# Patient Record
Sex: Male | Born: 1935 | Race: White | Hispanic: No | Smoking: Never smoker
Health system: Southern US, Community
[De-identification: ages and names within clinical notes are randomized; demographics above are authoritative.]

## PROBLEM LIST (undated history)

## (undated) DIAGNOSIS — N390 Urinary tract infection, site not specified: Secondary | ICD-10-CM

## (undated) DIAGNOSIS — E538 Deficiency of other specified B group vitamins: Secondary | ICD-10-CM

## (undated) DIAGNOSIS — Z87442 Personal history of urinary calculi: Secondary | ICD-10-CM

## (undated) DIAGNOSIS — M47812 Spondylosis without myelopathy or radiculopathy, cervical region: Secondary | ICD-10-CM

## (undated) DIAGNOSIS — N201 Calculus of ureter: Principal | ICD-10-CM

## (undated) DIAGNOSIS — E785 Hyperlipidemia, unspecified: Secondary | ICD-10-CM

## (undated) DIAGNOSIS — I82409 Acute embolism and thrombosis of unspecified deep veins of unspecified lower extremity: Secondary | ICD-10-CM

## (undated) DIAGNOSIS — E7800 Pure hypercholesterolemia, unspecified: Secondary | ICD-10-CM

## (undated) HISTORY — DX: Spondylosis without myelopathy or radiculopathy, cervical region: M47.812

## (undated) HISTORY — DX: Urinary tract infection, site not specified: N39.0

## (undated) HISTORY — DX: Hyperlipidemia, unspecified: E78.5

## (undated) HISTORY — DX: Calculus of ureter: N20.1

## (undated) HISTORY — PX: EYE SURGERY: SHX253

## (undated) HISTORY — DX: Deficiency of other specified B group vitamins: E53.8

---

## 1968-05-26 HISTORY — PX: FRACTURE SURGERY: SHX138

## 1996-05-26 DIAGNOSIS — I82409 Acute embolism and thrombosis of unspecified deep veins of unspecified lower extremity: Secondary | ICD-10-CM

## 1996-05-26 HISTORY — DX: Acute embolism and thrombosis of unspecified deep veins of unspecified lower extremity: I82.409

## 2006-05-26 HISTORY — PX: HERNIA REPAIR: SHX51

## 2010-06-25 ENCOUNTER — Ambulatory Visit: Payer: Self-pay | Admitting: Ophthalmology

## 2014-02-13 DIAGNOSIS — Z136 Encounter for screening for cardiovascular disorders: Secondary | ICD-10-CM | POA: Insufficient documentation

## 2014-02-13 DIAGNOSIS — E538 Deficiency of other specified B group vitamins: Secondary | ICD-10-CM | POA: Insufficient documentation

## 2014-02-13 DIAGNOSIS — E785 Hyperlipidemia, unspecified: Secondary | ICD-10-CM

## 2014-02-13 HISTORY — DX: Hyperlipidemia, unspecified: E78.5

## 2014-02-13 HISTORY — DX: Deficiency of other specified B group vitamins: E53.8

## 2015-01-16 DIAGNOSIS — Z0000 Encounter for general adult medical examination without abnormal findings: Secondary | ICD-10-CM | POA: Insufficient documentation

## 2016-08-08 DIAGNOSIS — M47812 Spondylosis without myelopathy or radiculopathy, cervical region: Secondary | ICD-10-CM

## 2016-08-08 HISTORY — DX: Spondylosis without myelopathy or radiculopathy, cervical region: M47.812

## 2016-09-11 DIAGNOSIS — Z2821 Immunization not carried out because of patient refusal: Secondary | ICD-10-CM | POA: Insufficient documentation

## 2016-11-30 ENCOUNTER — Encounter: Payer: Self-pay | Admitting: *Deleted

## 2016-11-30 ENCOUNTER — Observation Stay
Admission: EM | Admit: 2016-11-30 | Discharge: 2016-12-01 | Disposition: A | Discharge status: Home / Self Care | Payer: Medicare Other | Attending: Urology | Admitting: Urology

## 2016-11-30 ENCOUNTER — Emergency Department: Payer: Medicare Other

## 2016-11-30 ENCOUNTER — Encounter
Admission: EM | Disposition: A | Discharge status: Home / Self Care | Payer: Self-pay | Source: Home / Self Care | Attending: Emergency Medicine

## 2016-11-30 DIAGNOSIS — N179 Acute kidney failure, unspecified: Secondary | ICD-10-CM

## 2016-11-30 DIAGNOSIS — Z79899 Other long term (current) drug therapy: Secondary | ICD-10-CM | POA: Diagnosis not present

## 2016-11-30 DIAGNOSIS — N201 Calculus of ureter: Principal | ICD-10-CM | POA: Diagnosis present

## 2016-11-30 DIAGNOSIS — E7800 Pure hypercholesterolemia, unspecified: Secondary | ICD-10-CM | POA: Insufficient documentation

## 2016-11-30 DIAGNOSIS — D72829 Elevated white blood cell count, unspecified: Secondary | ICD-10-CM | POA: Diagnosis not present

## 2016-11-30 DIAGNOSIS — Z7982 Long term (current) use of aspirin: Secondary | ICD-10-CM | POA: Diagnosis not present

## 2016-11-30 DIAGNOSIS — N390 Urinary tract infection, site not specified: Secondary | ICD-10-CM | POA: Insufficient documentation

## 2016-11-30 DIAGNOSIS — M8580 Other specified disorders of bone density and structure, unspecified site: Secondary | ICD-10-CM | POA: Insufficient documentation

## 2016-11-30 DIAGNOSIS — N200 Calculus of kidney: Secondary | ICD-10-CM | POA: Diagnosis present

## 2016-11-30 DIAGNOSIS — E872 Acidosis: Secondary | ICD-10-CM | POA: Diagnosis not present

## 2016-11-30 DIAGNOSIS — M1990 Unspecified osteoarthritis, unspecified site: Secondary | ICD-10-CM | POA: Insufficient documentation

## 2016-11-30 HISTORY — DX: Pure hypercholesterolemia, unspecified: E78.00

## 2016-11-30 HISTORY — DX: Acute embolism and thrombosis of unspecified deep veins of unspecified lower extremity: I82.409

## 2016-11-30 HISTORY — DX: Calculus of ureter: N20.1

## 2016-11-30 HISTORY — DX: Urinary tract infection, site not specified: N39.0

## 2016-11-30 LAB — URINALYSIS, COMPLETE (UACMP) WITH MICROSCOPIC
Bilirubin Urine: NEGATIVE
GLUCOSE, UA: NEGATIVE mg/dL
Ketones, ur: NEGATIVE mg/dL
NITRITE: NEGATIVE
PH: 5 (ref 5.0–8.0)
Protein, ur: 30 mg/dL — AB
SPECIFIC GRAVITY, URINE: 1.021 (ref 1.005–1.030)
Squamous Epithelial / LPF: NONE SEEN

## 2016-11-30 LAB — LIPASE, BLOOD: Lipase: 26 U/L (ref 11–51)

## 2016-11-30 LAB — PROCALCITONIN: Procalcitonin: <0.1 ng/mL

## 2016-11-30 LAB — CBC
HCT: 48.3 % (ref 40.0–52.0)
Hemoglobin: 16.2 g/dL (ref 13.0–18.0)
MCH: 31.1 pg (ref 26.0–34.0)
MCHC: 33.6 g/dL (ref 32.0–36.0)
MCV: 92.4 fL (ref 80.0–100.0)
Platelets: 196 10*3/uL (ref 150–440)
RBC: 5.22 MIL/uL (ref 4.40–5.90)
RDW: 13.9 % (ref 11.5–14.5)
WBC: 11 10*3/uL — AB (ref 3.8–10.6)

## 2016-11-30 LAB — COMPREHENSIVE METABOLIC PANEL
ALBUMIN: 4.3 g/dL (ref 3.5–5.0)
ALK PHOS: 145 U/L — AB (ref 38–126)
ALT: 17 U/L (ref 17–63)
ANION GAP: 11 (ref 5–15)
AST: 30 U/L (ref 15–41)
BILIRUBIN TOTAL: 0.8 mg/dL (ref 0.3–1.2)
BUN: 35 mg/dL — AB (ref 6–20)
CALCIUM: 9.4 mg/dL (ref 8.9–10.3)
CO2: 25 mmol/L (ref 22–32)
Chloride: 101 mmol/L (ref 101–111)
Creatinine, Ser: 1.41 mg/dL — ABNORMAL HIGH (ref 0.61–1.24)
GFR calc Af Amer: 50 mL/min — ABNORMAL LOW (ref >60)
GFR, EST NON AFRICAN AMERICAN: 43 mL/min — AB (ref >60)
GLUCOSE: 115 mg/dL — AB (ref 65–99)
Potassium: 4.6 mmol/L (ref 3.5–5.1)
Sodium: 137 mmol/L (ref 135–145)
Total Protein: 7.9 g/dL (ref 6.5–8.1)

## 2016-11-30 LAB — LACTIC ACID, PLASMA: Lactic Acid, Venous: 2 mmol/L (ref 0.5–1.9)

## 2016-11-30 SURGERY — CYSTOSCOPY, WITH STENT INSERTION
Anesthesia: Choice | Laterality: Right

## 2016-11-30 MED ORDER — FENTANYL CITRATE (PF) 100 MCG/2ML IJ SOLN
75.0000 ug | Freq: Once | INTRAMUSCULAR | Status: AC
  Administered 2016-11-30: 75 ug via INTRAVENOUS
  Filled 2016-11-30: qty 2

## 2016-11-30 MED ORDER — FENTANYL CITRATE (PF) 100 MCG/2ML IJ SOLN
INTRAMUSCULAR | Status: AC
  Filled 2016-11-30: qty 2

## 2016-11-30 MED ORDER — ONDANSETRON HCL 4 MG/2ML IJ SOLN
4.0000 mg | Freq: Once | INTRAMUSCULAR | Status: AC
  Administered 2016-11-30: 4 mg via INTRAVENOUS

## 2016-11-30 MED ORDER — IOPAMIDOL (ISOVUE-300) INJECTION 61%
75.0000 mL | Freq: Once | INTRAVENOUS | Status: AC | PRN
  Administered 2016-11-30: 75 mL via INTRAVENOUS

## 2016-11-30 MED ORDER — SUCCINYLCHOLINE CHLORIDE 20 MG/ML IJ SOLN
INTRAMUSCULAR | Status: AC
  Filled 2016-11-30: qty 1

## 2016-11-30 MED ORDER — ONDANSETRON HCL 4 MG/2ML IJ SOLN
INTRAMUSCULAR | Status: AC
  Filled 2016-11-30: qty 2

## 2016-11-30 MED ORDER — FENTANYL CITRATE (PF) 100 MCG/2ML IJ SOLN
INTRAMUSCULAR | Status: AC
  Administered 2016-12-01: 75 ug via INTRAVENOUS
  Filled 2016-11-30: qty 2

## 2016-11-30 MED ORDER — FENTANYL CITRATE (PF) 100 MCG/2ML IJ SOLN
75.0000 ug | Freq: Once | INTRAMUSCULAR | Status: AC
  Administered 2016-11-30 – 2016-12-01 (×2): 75 ug via INTRAVENOUS

## 2016-11-30 MED ORDER — DEXAMETHASONE SODIUM PHOSPHATE 10 MG/ML IJ SOLN
INTRAMUSCULAR | Status: AC
  Filled 2016-11-30: qty 1

## 2016-11-30 MED ORDER — SODIUM CHLORIDE 0.9 % IV BOLUS (SEPSIS)
1000.0000 mL | Freq: Once | INTRAVENOUS | Status: AC
  Administered 2016-11-30: 1000 mL via INTRAVENOUS

## 2016-11-30 MED ORDER — PROPOFOL 10 MG/ML IV BOLUS
INTRAVENOUS | Status: AC
  Filled 2016-11-30: qty 20

## 2016-11-30 MED ORDER — ONDANSETRON HCL 4 MG/2ML IJ SOLN
INTRAMUSCULAR | Status: AC
  Administered 2016-11-30: 4 mg via INTRAVENOUS
  Filled 2016-11-30: qty 2

## 2016-11-30 MED ORDER — LIDOCAINE HCL (PF) 2 % IJ SOLN
INTRAMUSCULAR | Status: AC
  Filled 2016-11-30: qty 2

## 2016-11-30 MED ORDER — DEXTROSE 5 % IV SOLN
1.0000 g | Freq: Once | INTRAVENOUS | Status: AC
  Administered 2016-11-30: 1 g via INTRAVENOUS
  Filled 2016-11-30: qty 10

## 2016-11-30 NOTE — ED Provider Notes (Signed)
[facility] Emergency Department Provider Note  ____________________________________________   First MD Initiated Contact with Patient 11/30/16 2143     (approximate)  I have reviewed the triage vital signs and the nursing notes.   HISTORY  Chief Complaint Abdominal Pain   HPI Lance Jones is a 83 y.o. male who comes to the emergency department with severe diffuse abdominal pain nausea and vomiting. He has a past surgical history of left inguinal hernia repair. Ever since about 3 PM today he has been unable to keep any food or water down. His pain is worse with movement and somewhat alleviated by rest. Further history is challenging to obtain secondary to the significant degree of the patient's discomfort.   History reviewed. No pertinent past medical history.  There are no active problems to display for this patient.   Past Surgical History:  Procedure Laterality Date  . HERNIA REPAIR      Prior to Admission medications   Medication Sig Start Date End Date Taking? Authorizing Provider  aspirin EC 81 MG tablet Take 81 mg by mouth daily. 07/03/09  Yes [provider]  atorvastatin (LIPITOR) 20 MG tablet Take 20 mg by mouth daily. 10/25/16  Yes [provider]  cyanocobalamin (,VITAMIN B-12,) 1000 MCG/ML injection Inject 1,000 mcg into the skin every 14 (fourteen) days. 07/25/16  Yes [provider]  Multiple Vitamins-Minerals (PRESERVISION AREDS PO) Take 1 tablet by mouth daily.   Yes [provider]    Allergies Patient has no known allergies.  History reviewed. No pertinent family history.  Social History Social History  Substance Use Topics  . Smoking status: Never Smoker  . Smokeless tobacco: Never Used  . Alcohol use No    Review of Systems Constitutional: No fever/chills Eyes: No visual changes. ENT: No sore throat. Cardiovascular: Denies chest pain. Respiratory: Denies shortness of  breath. Gastrointestinal: Positive abdominal pain.  Positive nausea, positive vomiting.  No diarrhea.  No constipation. Genitourinary: Negative for dysuria. Musculoskeletal: Negative for back pain. Skin: Negative for rash. Neurological: Negative for headaches, focal weakness or numbness.   ____________________________________________   PHYSICAL EXAM:  VITAL SIGNS: ED Triage Vitals  Enc Vitals Group     BP 11/30/16 1827 (!) 191/86     Pulse Rate 11/30/16 1827 77     Resp 11/30/16 1827 16     Temp 11/30/16 1827 97.8 F (36.6 C)     Temp Source 11/30/16 1827 Oral     SpO2 11/30/16 1827 98 %     Weight 11/30/16 1828 176 lb (79.8 kg)     Height 11/30/16 1828 6' (1.829 m)     Head Circumference --      Peak Flow --      Pain Score 11/30/16 1827 4     Pain Loc --      Pain Edu? --      Excl. in GC? --     Constitutional: Alert and oriented 4 appears extremely uncomfortable moaning and grimacing in pain Eyes: PERRL EOMI. Head: Atraumatic. Nose: No congestion/rhinnorhea. Mouth/Throat: No trismus Neck: No stridor.   Cardiovascular: Normal rate, regular rhythm. Grossly normal heart sounds.  Good peripheral circulation. Respiratory: Normal respiratory effort.  No retractions. Lungs CTAB and moving good air Gastrointestinal: Extremely tender abdomen diffusely with some rebound and guarding no focality noted significant right-sided costovertebral tenderness Musculoskeletal: No lower extremity edema   Neurologic:  Normal speech and language. No gross focal neurologic deficits are appreciated. Skin:  Skin is warm, dry and intact. No rash noted. Psychiatric: Mood and affect are normal. Speech and behavior are normal.    ____________________________________________   DIFFERENTIAL includes but not limited to  Appendicitis, diverticulitis, renal colic, pyelonephritis, mesenteric ischemia, volvulus ____________________________________________   LABS (all labs ordered are listed,  but only abnormal results are displayed)  Labs Reviewed  COMPREHENSIVE METABOLIC PANEL - Abnormal; Notable for the following:       Result Value   Glucose, Bld 115 (*)    BUN 35 (*)    Creatinine, Ser 1.41 (*)    Alkaline Phosphatase 145 (*)    GFR calc non Af Amer 43 (*)    GFR calc Af Amer 50 (*)    All other components within normal limits  CBC - Abnormal; Notable for the following:    WBC 11.0 (*)    All other components within normal limits  URINALYSIS, COMPLETE (UACMP) WITH MICROSCOPIC - Abnormal; Notable for the following:    Color, Urine YELLOW (*)    APPearance CLOUDY (*)    Hgb urine dipstick MODERATE (*)    Protein, ur 30 (*)    Leukocytes, UA SMALL (*)    Bacteria, UA RARE (*)    All other components within normal limits  LACTIC ACID, PLASMA - Abnormal; Notable for the following:    Lactic Acid, Venous 2.0 (*)    All other components within normal limits  CULTURE, BLOOD (ROUTINE X 2)  CULTURE, BLOOD (ROUTINE X 2)  URINE CULTURE  LIPASE, BLOOD  LACTIC ACID, PLASMA  PROCALCITONIN    GFR of 43 down from last GFR of 57 on April 18 this year __________________________________________  EKG   ____________________________________________  RADIOLOGY  CT scan shows large right-sided kidney stone with hydronephrosis and decreased right renal function   PROCEDURES  Procedure(s) performed: no  Procedures  Critical Care performed: yes  CRITICAL CARE Performed by: Merrily Brittle   Total critical care time: 35 minutes  Critical care time was exclusive of separately billable procedures and treating other patients.  Critical care was necessary to treat or prevent imminent or life-threatening deterioration.  Critical care was time spent personally by me on the following activities: development of treatment plan with patient and/or surrogate as well as nursing, discussions with consultants, evaluation of patient's response to treatment, examination of patient,  obtaining history from patient or surrogate, ordering and performing treatments and interventions, ordering and review of laboratory studies, ordering and review of radiographic studies, pulse oximetry and re-evaluation of patient's condition.   Observation: no ____________________________________________   INITIAL IMPRESSION / ASSESSMENT AND PLAN / ED COURSE  Pertinent labs & imaging results that were available during my care of the patient were reviewed by me and considered in my medical decision making (see chart for details).  The patient arrives extremely uncomfortable appearing with tender abdomen. Differential for an 83 year old with this degree of abdominal pain is broad but she requires a lactate as well as a CT scan with IV contrast. IV fentanyl now.  After 75 g of fentanyl the patient is still in persistent pain requiring another 75 g of fentanyl. By my read his CT scan appears to show a 1 cm stone on the right side with hydronephrosis. While he has no nitrites he does have significant costovertebral tenderness and suggestion of possible infection. I anticipate a urology consult.      __________----------------------------------------- 10:57 PM on 11/30/2016 -----------------------------------------  I discussed the case with on-call urologist Dr. Annabell Howells who  will kindly consult on the case. He will evaluate CT scan but anticipates he will take the patient to the operating room tonight for a stent. __________________________________   FINAL CLINICAL IMPRESSION(S) / ED DIAGNOSES  Final diagnoses:  Kidney stone  Acute kidney injury (HCC)      NEW MEDICATIONS STARTED DURING THIS VISIT:  New Prescriptions   No medications on file     Note:  This document was prepared using Dragon voice recognition software and may include unintentional dictation errors.     Merrily Brittleifenbark, [place] Wherry, MD 11/30/16 2258

## 2016-11-30 NOTE — H&P (Signed)
Subjective: CC: Right flank pain.  Hx: I was asked to see Mr. Basher by Dr. Lamont Snowball for an 8mm right proximal ureteral stone with poor pain control and a possible UTI with a WBC count of 11K and a lactate of 2.  The has TNTC WBC and RBC but no fever or chills.  He first had some pain last Monday but it came back Sunday afternoon and was severe with nausea but no hematuria or voiding complaints.   He has no prior GU history.   He continues to have some pain despite pain medication.   He has no associated signs or symptoms.  ROS:  Review of Systems  Genitourinary: Positive for flank pain.  All other systems reviewed and are negative.   No Known Allergies  Past Medical History:  Diagnosis Date  . DVT (deep venous thrombosis) (HCC) 1998   left leg  . Hypercholesteremia     Past Surgical History:  Procedure Laterality Date  . HERNIA REPAIR      Social History   Social History  . Marital status: Married    Spouse name: N/A  . Number of children: N/A  . Years of education: N/A   Occupational History  . Not on file.   Social History Main Topics  . Smoking status: Never Smoker  . Smokeless tobacco: Never Used  . Alcohol use No  . Drug use: No  . Sexual activity: No   Other Topics Concern  . Not on file   Social History Narrative  . No narrative on file    History reviewed. No pertinent family history.  Anti-infectives: Anti-infectives    Start     Dose/Rate Route Frequency Ordered Stop   11/30/16 2200  cefTRIAXone (ROCEPHIN) 1 g in dextrose 5 % 50 mL IVPB     1 g 100 mL/hr over 30 Minutes Intravenous  Once 11/30/16 2148 11/30/16 2302      No current facility-administered medications for this encounter.    Current Outpatient Prescriptions  Medication Sig Dispense Refill  . aspirin EC 81 MG tablet Take 81 mg by mouth daily.    Marland Kitchen atorvastatin (LIPITOR) 20 MG tablet Take 20 mg by mouth daily.  1  . cyanocobalamin (,VITAMIN B-12,) 1000 MCG/ML injection  Inject 1,000 mcg into the skin every 14 (fourteen) days.    . Multiple Vitamins-Minerals (PRESERVISION AREDS PO) Take 1 tablet by mouth daily.       Objective: Vital signs in last 24 hours: Temp:  [97.7 F (36.5 C)-97.8 F (36.6 C)] 97.7 F (36.5 C) (07/08 2044) Pulse Rate:  [68-77] 68 (07/08 2258) Resp:  [16-24] 22 (07/08 2258) BP: (182-191)/(80-86) 182/80 (07/08 2258) SpO2:  [98 %-100 %] 99 % (07/08 2258) Weight:  [79.8 kg (176 lb)] 79.8 kg (176 lb) (07/08 1828)  Intake/Output from previous day: No intake/output data recorded. Intake/Output this shift: No intake/output data recorded.   Physical Exam  Constitutional: He is oriented to person, place, and time and well-developed, well-nourished, and in no distress.  HENT:  Head: Normocephalic and atraumatic.  Neck: Normal range of motion. Neck supple. No thyromegaly present.  Cardiovascular: Normal rate, regular rhythm and normal heart sounds.   Pulmonary/Chest: Effort normal and breath sounds normal. No respiratory distress.  Abdominal: Soft. He exhibits no mass. There is tenderness (right CVAT).  Musculoskeletal: Normal range of motion. He exhibits no edema.  Lymphadenopathy:    He has no cervical adenopathy.       Right: No supraclavicular adenopathy present.  Left: No supraclavicular adenopathy present.  Neurological: He is alert and oriented to person, place, and time.  Skin: Skin is warm and dry.  Psychiatric: Mood and affect normal.  Vitals reviewed.   Lab Results:   Recent Labs  11/30/16 1826  WBC 11.0*  HGB 16.2  HCT 48.3  PLT 196   BMET  Recent Labs  11/30/16 1826  NA 137  K 4.6  CL 101  CO2 25  GLUCOSE 115*  BUN 35*  CREATININE 1.41*  CALCIUM 9.4   PT/INR No results for input(s): LABPROT, INR in the last 72 hours. ABG No results for input(s): PHART, HCO3 in the last 72 hours.  Invalid input(s): PCO2, PO2  Studies/Results: Ct Abdomen Pelvis W Contrast  Result Date:  11/30/2016 CLINICAL DATA:  Bilateral lower quadrant pain beginning today with nausea. History of hernia repair. EXAM: CT ABDOMEN AND PELVIS WITH CONTRAST TECHNIQUE: Multidetector CT imaging of the abdomen and pelvis was performed using the standard protocol following bolus administration of intravenous contrast. CONTRAST:  75mL ISOVUE-300 IOPAMIDOL (ISOVUE-300) INJECTION 61% COMPARISON:  None. FINDINGS: LOWER CHEST: Dependent atelectasis or scarring. 3 mm sub solid RIGHT middle lobe subpleural pulmonary nodules likely benign. The heart is mildly enlarged. No pericardial effusions. 19 mm partially imaged cystic lesion adjacent to RIGHT atrium suggesting pericardial cyst. HEPATOBILIARY: Multiple subcentimeter gallstones without CT findings of acute cholecystitis. Normal liver. PANCREAS: Normal. SPLEEN: Normal. ADRENALS/URINARY TRACT: Kidneys are orthotopic, delayed RIGHT nephrogram. Moderate RIGHT hydro nephrosis to the proximal ureter where a 5 x 8 x 10 mm calculus is present. Small LEFT parapelvic cysts and 2 mm LEFT lower pole nephrolithiasis. No RIGHT contrast excretion on delayed phase. No solid renal mass. Urinary bladder is well distended and unremarkable. STOMACH/BOWEL: The stomach, small and large bowel are normal in course and caliber without inflammatory changes. Mild sigmoid diverticulosis. Normal appendix. VASCULAR/LYMPHATIC: Aortoiliac vessels are normal in course and caliber, mild calcific atherosclerosis. No lymphadenopathy by CT size criteria. REPRODUCTIVE: Mild prostatomegaly. OTHER: No intraperitoneal free fluid or free air. MUSCULOSKELETAL: Nonacute. Status post LEFT inguinal hernia repair with small fat containing inguinal hernia. Osteopenia. IMPRESSION: 1. 5 x 8 x 10 mm proximal RIGHT ureteral calculus results in moderate obstructive uropathy and decreased RIGHT renal function. 2. 2 mm nonobstructing LEFT lower pole nephrolithiasis. 3. Cholelithiasis without CT findings of acute cholecystitis.  Aortic Atherosclerosis (ICD10-I70.0). Electronically Signed   By: Awilda Metroourtnay  Bloomer M.D.   On: 11/30/2016 22:42   CT films and report reviewed.   Labs reviewed.   Case discussed with Dr. Lamont Snowballifenbark.   Assessment: Right proximal ureteral stone with obstruction and possible infection.   I am going to take him for cystoscopy and stenting this evening.   Risks of bleeding, infection, ureteral injury, need for secondary procedures, thrombotic events and anesthetic risks reviewed.   ARI with Cr of 1.41.    CC: Dr. Milagros EvenerNiel Rifenbark      Bjorn PippinWRENN,Pricella Gaugh J 12/01/2016 [phone]

## 2016-11-30 NOTE — ED Notes (Signed)
Pt sitting in wheelchair.  Pt alert.  Speech clear.  Pt continues to have right side pain.  intermittent nausea.  Family with pt.

## 2016-11-30 NOTE — Anesthesia Preprocedure Evaluation (Signed)
Anesthesia Evaluation  Patient identified by MRN, date of birth, ID band Patient awake    Reviewed: Allergy & Precautions  Airway Mallampati: II       Dental no notable dental hx.    Pulmonary neg pulmonary ROS,    Pulmonary exam normal        Cardiovascular negative cardio ROS Normal cardiovascular exam     Neuro/Psych negative neurological ROS  negative psych ROS   GI/Hepatic negative GI ROS, Neg liver ROS,   Endo/Other  negative endocrine ROS  Renal/GU Renal InsufficiencyRenal disease     Musculoskeletal  (+) Arthritis ,   Abdominal Normal abdominal exam  (+)   Peds negative pediatric ROS (+)  Hematology negative hematology ROS (+)   Anesthesia Other Findings History reviewed. No pertinent past medical history.  Reproductive/Obstetrics                             Anesthesia Physical Anesthesia Plan  ASA: II and emergent  Anesthesia Plan: General   Post-op Pain Management:    Induction:   PONV Risk Score and Plan: 3 and Ondansetron, Dexamethasone, Propofol, Midazolam and Treatment may vary due to age or medical condition  Airway Management Planned: Oral ETT  Additional Equipment:   Intra-op Plan:   Post-operative Plan: Extubation in OR  Informed Consent:   Plan Discussed with: CRNA and Surgeon  Anesthesia Plan Comments:         Anesthesia Quick Evaluation

## 2016-11-30 NOTE — ED Triage Notes (Signed)
PT to ED reporting lower abd pain in RLQ and LLQ. Beginning at 150 today. Pt reports nausea with one episode of vomiting. No diarrhea or fevers reported. Tenderness noted upon palpation.

## 2016-11-30 NOTE — ED Notes (Signed)
MD Rifenbark notified about lactic 2.0.

## 2016-12-01 ENCOUNTER — Emergency Department: Payer: Medicare Other | Admitting: Anesthesiology

## 2016-12-01 ENCOUNTER — Telehealth: Payer: Self-pay | Admitting: Urology

## 2016-12-01 ENCOUNTER — Encounter
Admission: EM | Disposition: A | Discharge status: Home / Self Care | Payer: Self-pay | Source: Home / Self Care | Attending: Emergency Medicine

## 2016-12-01 DIAGNOSIS — N201 Calculus of ureter: Secondary | ICD-10-CM | POA: Diagnosis not present

## 2016-12-01 HISTORY — DX: Calculus of ureter: N20.1

## 2016-12-01 HISTORY — PX: CYSTOSCOPY WITH STENT PLACEMENT: SHX5790

## 2016-12-01 LAB — BASIC METABOLIC PANEL
ANION GAP: 10 (ref 5–15)
BUN: 33 mg/dL — AB (ref 6–20)
CHLORIDE: 105 mmol/L (ref 101–111)
CO2: 22 mmol/L (ref 22–32)
Calcium: 8.8 mg/dL — ABNORMAL LOW (ref 8.9–10.3)
Creatinine, Ser: 1.6 mg/dL — ABNORMAL HIGH (ref 0.61–1.24)
GFR calc Af Amer: 43 mL/min — ABNORMAL LOW (ref >60)
GFR, EST NON AFRICAN AMERICAN: 37 mL/min — AB (ref >60)
GLUCOSE: 140 mg/dL — AB (ref 65–99)
POTASSIUM: 4.5 mmol/L (ref 3.5–5.1)
Sodium: 137 mmol/L (ref 135–145)

## 2016-12-01 LAB — LACTIC ACID, PLASMA: Lactic Acid, Venous: 1.5 mmol/L (ref 0.5–1.9)

## 2016-12-01 LAB — HEMOGLOBIN AND HEMATOCRIT, BLOOD
HEMATOCRIT: 43.7 % (ref 40.0–52.0)
Hemoglobin: 15 g/dL (ref 13.0–18.0)

## 2016-12-01 SURGERY — CYSTOSCOPY, WITH STENT INSERTION
Anesthesia: General | Laterality: Right

## 2016-12-01 MED ORDER — ATORVASTATIN CALCIUM 20 MG PO TABS: 20.0000 mg | ORAL_TABLET | Freq: Every day | ORAL | Status: DC

## 2016-12-01 MED ORDER — SULFAMETHOXAZOLE-TRIMETHOPRIM 800-160 MG PO TABS: 1.0000 | ORAL_TABLET | Freq: Two times a day (BID) | ORAL | 0 refills | Status: DC

## 2016-12-01 MED ORDER — ONDANSETRON HCL 4 MG/2ML IJ SOLN
INTRAMUSCULAR | Status: DC | PRN
  Administered 2016-12-01: 4 mg via INTRAVENOUS

## 2016-12-01 MED ORDER — POTASSIUM CHLORIDE IN NACL 20-0.45 MEQ/L-% IV SOLN
INTRAVENOUS | Status: DC
  Administered 2016-12-01: 03:00:00 via INTRAVENOUS
  Filled 2016-12-01 (×3): qty 1000

## 2016-12-01 MED ORDER — SENNOSIDES-DOCUSATE SODIUM 8.6-50 MG PO TABS: 1.0000 | ORAL_TABLET | Freq: Every evening | ORAL | Status: DC | PRN

## 2016-12-01 MED ORDER — ONDANSETRON HCL 4 MG/2ML IJ SOLN: 4.0000 mg | Freq: Once | INTRAMUSCULAR | Status: DC | PRN

## 2016-12-01 MED ORDER — FENTANYL CITRATE (PF) 100 MCG/2ML IJ SOLN: 25.0000 ug | INTRAMUSCULAR | Status: DC | PRN

## 2016-12-01 MED ORDER — EPHEDRINE SULFATE 50 MG/ML IJ SOLN
INTRAMUSCULAR | Status: DC | PRN
  Administered 2016-12-01: 10 mg via INTRAVENOUS

## 2016-12-01 MED ORDER — SODIUM CHLORIDE 0.9 % IV SOLN
INTRAVENOUS | Status: DC | PRN
  Administered 2016-12-01: via INTRAVENOUS

## 2016-12-01 MED ORDER — HYOSCYAMINE SULFATE 0.125 MG SL SUBL
0.1250 mg | SUBLINGUAL_TABLET | SUBLINGUAL | Status: DC | PRN
  Filled 2016-12-01: qty 1

## 2016-12-01 MED ORDER — PROPOFOL 10 MG/ML IV BOLUS
INTRAVENOUS | Status: DC | PRN
  Administered 2016-12-01: 110 mg via INTRAVENOUS

## 2016-12-01 MED ORDER — BISACODYL 10 MG RE SUPP: 10.0000 mg | Freq: Every day | RECTAL | Status: DC | PRN

## 2016-12-01 MED ORDER — HYDROMORPHONE HCL 1 MG/ML IJ SOLN: 0.5000 mg | INTRAMUSCULAR | Status: DC | PRN

## 2016-12-01 MED ORDER — ASPIRIN EC 81 MG PO TBEC
81.0000 mg | DELAYED_RELEASE_TABLET | Freq: Every day | ORAL | Status: DC
  Administered 2016-12-01: 81 mg via ORAL
  Filled 2016-12-01: qty 1

## 2016-12-01 MED ORDER — HYDROCODONE-ACETAMINOPHEN 5-325 MG PO TABS: 1.0000 | ORAL_TABLET | ORAL | Status: DC | PRN

## 2016-12-01 MED ORDER — HYDROCODONE-ACETAMINOPHEN 5-325 MG PO TABS: 1.0000 | ORAL_TABLET | ORAL | 0 refills | Status: DC | PRN

## 2016-12-01 MED ORDER — CEFTRIAXONE SODIUM 1 G IJ SOLR
1.0000 g | INTRAMUSCULAR | Status: DC
  Filled 2016-12-01: qty 10

## 2016-12-01 MED ORDER — DEXAMETHASONE SODIUM PHOSPHATE 10 MG/ML IJ SOLN
INTRAMUSCULAR | Status: DC | PRN
  Administered 2016-12-01: 10 mg via INTRAVENOUS

## 2016-12-01 MED ORDER — ACETAMINOPHEN 325 MG PO TABS: 650.0000 mg | ORAL_TABLET | ORAL | Status: DC | PRN

## 2016-12-01 MED ORDER — ONDANSETRON HCL 4 MG/2ML IJ SOLN: 4.0000 mg | INTRAMUSCULAR | Status: DC | PRN

## 2016-12-01 MED ORDER — SUCCINYLCHOLINE CHLORIDE 20 MG/ML IJ SOLN
INTRAMUSCULAR | Status: DC | PRN
  Administered 2016-12-01: 100 mg via INTRAVENOUS

## 2016-12-01 MED ORDER — FLEET ENEMA 7-19 GM/118ML RE ENEM: 1.0000 | ENEMA | Freq: Once | RECTAL | Status: DC | PRN

## 2016-12-01 MED ORDER — LIDOCAINE HCL (CARDIAC) 20 MG/ML IV SOLN
INTRAVENOUS | Status: DC | PRN
  Administered 2016-12-01: 100 mg via INTRAVENOUS

## 2016-12-01 MED ORDER — FENTANYL CITRATE (PF) 100 MCG/2ML IJ SOLN
INTRAMUSCULAR | Status: DC | PRN
  Administered 2016-12-01 (×2): 50 ug via INTRAVENOUS

## 2016-12-01 MED ORDER — BELLADONNA ALKALOIDS-OPIUM 16.2-60 MG RE SUPP
RECTAL | Status: AC
  Filled 2016-12-01: qty 1

## 2016-12-01 SURGICAL SUPPLY — 19 items
BAG DRAIN CYSTO-URO LG1000N (MISCELLANEOUS) ×3 IMPLANT
CANISTER SUCT LVC 12 LTR MEDI- (MISCELLANEOUS) ×3 IMPLANT
DRAPE XRAY CASSETTE 23X24 (DRAPES) ×3 IMPLANT
GLOVE BIOGEL M 7.0 STRL (GLOVE) ×12 IMPLANT
GOWN STRL REUS W/ TWL LRG LVL4 (GOWN DISPOSABLE) ×1 IMPLANT
GOWN STRL REUS W/ TWL XL LVL3 (GOWN DISPOSABLE) ×1 IMPLANT
GOWN STRL REUS W/TWL LRG LVL4 (GOWN DISPOSABLE) ×2
GOWN STRL REUS W/TWL XL LVL3 (GOWN DISPOSABLE) ×2
KIT RM TURNOVER CYSTO AR (KITS) ×3 IMPLANT
NS IRRIG 500ML POUR BTL (IV SOLUTION) IMPLANT
PACK CYSTO AR (MISCELLANEOUS) ×3 IMPLANT
SENSORWIRE 0.038 NOT ANGLED (WIRE) ×3
SET CYSTO W/LG BORE CLAMP LF (SET/KITS/TRAYS/PACK) ×3 IMPLANT
SLEEVE SCD COMPRESS THIGH MED (MISCELLANEOUS) ×3 IMPLANT
SOL .9 NS 3000ML IRR  AL (IV SOLUTION) ×2
SOL .9 NS 3000ML IRR UROMATIC (IV SOLUTION) ×1 IMPLANT
STENT URET 6FRX26 CONTOUR (STENTS) ×3 IMPLANT
WATER STERILE IRR 1000ML POUR (IV SOLUTION) ×3 IMPLANT
WIRE SENSOR 0.038 NOT ANGLED (WIRE) ×1 IMPLANT

## 2016-12-01 NOTE — Op Note (Signed)
NAMGerrit Jones:  Stemmer, Quirino            ACCOUNT NO.:  [id number]  MEDICAL RECORD NO.:  [id number]  LOCATION:  ED25A                        FACILITY:  [facility]  PHYSICIAN:  Excell SeltzerJohn J. Annabell HowellsWrenn, M.D.    DATE OF BIRTH:  04-20-1928  DATE OF PROCEDURE:  11/30/2016 DATE OF DISCHARGE:                              OPERATIVE REPORT   Patient of Dr. Bjorn PippinJohn Saige Canton.  PROCEDURE PERFORMED:  Cystoscopy with insertion of right double-J stent.  SURGEON:  Excell SeltzerJohn J. Annabell HowellsWrenn, M.D.  PREOPERATIVE DIAGNOSIS:  Right ureteropelvic junction stone with possible infection.  POSTOPERATIVE DIAGNOSIS:  Right ureteropelvic junction stone with possible infection.  SURGEON:  Excell SeltzerJohn J. Annabell HowellsWrenn, M.D.  ANESTHESIA:  General.  SPECIMEN:  None.  DRAINS:  A 6-French x 26 cm right double-J stent.  COMPLICATIONS:  None.  BLOOD LOSS:  None.  INDICATIONS:  Mr. Ysidro EvertCulberson is an 83 year old white male, who presented in the emergency room today with severe right flank pain.  He was found to have an 8 mm right UPJ/proximal ureteral stone.  The urine had too numerous to count white cells, too numerous to count red cells.  He had mild leukocytosis and elevated lactic acid.  It was felt that cystoscopy with stenting was indicated.  DESCRIPTION OF PROCEDURE:  He was taken to the operating room where a general anesthetic was induced.  He was placed in lithotomy position. His perineum and genitalia were prepped with Betadine solution, he was draped in usual sterile fashion.  He had received Rocephin earlier in the emergency room.  Cystoscopy was performed using a 23-French scope and 30-degree lens. Examination revealed a normal urethra.  The external sphincter was intact.  The prostatic urethra was short without obstruction. Examination of bladder revealed mild trabeculation.  No tumors, stones, or inflammation were noted.  Ureteral orifices were unremarkable.  The right ureteral orifice was cannulated with a Sensor or guidewire. This was  passed easily up the ureter to the stone where there was mild resistance, but the stone eventually popped back into the kidney and the wire passed into the kidney.  Brisk efflux of turbid urine was noted along the stent, increasing the concern for pyonephrosis infection.  A 6-French 26 cm Contour double-J stent was then passed over the wire without difficulty to the kidney.  Under fluoroscopic guidance, the wire was removed, leaving good coil in the kidney and a good coil in the bladder.  The bladder was drained.  The cystoscope was removed.  The patient was taken down from lithotomy position.  His anesthetic was reversed.  He was moved to the recovery room in stable condition.  There were no complications.     Excell SeltzerJohn J. Annabell HowellsWrenn, M.D.     JJW/MEDQ  D:  12/01/2016  T:  12/01/2016  Job:  [id number]

## 2016-12-01 NOTE — Transfer of Care (Signed)
Immediate Anesthesia Transfer of Care Note  Patient: Lance BellsJoseph S Blahut  Procedure(s) Performed: Procedure(s): CYSTOSCOPY WITH STENT PLACEMENT (Right)  Patient Location: PACU  Anesthesia Type:General  Level of Consciousness: sedated  Airway & Oxygen Therapy: Patient Spontanous Breathing and Patient connected to face mask oxygen  Post-op Assessment: Report given to RN and Post -op Vital signs reviewed and stable  Post vital signs: Reviewed and stable  Last Vitals:  Vitals:   11/30/16 2258 12/01/16 0044  BP: (!) 182/80 130/60  Pulse: 68 (!) 105  Resp: (!) 22 20  Temp:  36.7 C    Last Pain:  Vitals:   12/01/16 0044  TempSrc: Tympanic  PainSc:          Complications: No apparent anesthesia complications

## 2016-12-01 NOTE — Anesthesia Procedure Notes (Signed)
Procedure Name: Intubation Date/Time: 12/01/2016 12:17 AM Performed by: Ginger CarneMICHELET, Donnika Kucher Pre-anesthesia Checklist: Patient identified, Emergency Drugs available, Suction available, Patient being monitored and Timeout performed Patient Re-evaluated:Patient Re-evaluated prior to inductionOxygen Delivery Method: Circle system utilized Preoxygenation: Pre-oxygenation with 100% oxygen Intubation Type: IV induction, Rapid sequence and Cricoid Pressure applied Laryngoscope Size: Miller and 2 Grade View: Grade I Tube type: Oral Tube size: 7.5 mm Number of attempts: 1 Airway Equipment and Method: Stylet Placement Confirmation: ETT inserted through vocal cords under direct vision,  positive ETCO2 and breath sounds checked- equal and bilateral Secured at: 22 cm Tube secured with: Tape Dental Injury: Teeth and Oropharynx as per pre-operative assessment

## 2016-12-01 NOTE — Discharge Summary (Signed)
Date of admission: 11/30/2016  Date of discharge: 12/01/2016  Admission diagnosis: Right obstructing ureteral stone, UTI  Discharge diagnosis: same  Secondary diagnoses:  Patient Active Problem List   Diagnosis Date Noted  . Right ureteral stone 12/01/2016  . Ureteral calculus, right 11/30/2016  . Urinary tract infection 11/30/2016    History and Physical: For full details, please see admission history and physical. Briefly, MCARTHUR IVINS is a 83 y.o. year old patient with an obstructing 8 mm R UPJ stone s/p urgent ureteral stenting admitted overnight for observation.   Hospital Course: Patient tolerated the procedure well.  He was then transferred to the floor after an uneventful PACU stay.  His hospital course was uncomplicated.  On POD#1 he had met discharge criteria: was eating a regular diet, was up and ambulating independently,  pain was well controlled, was voiding without a catheter, and was ready to for discharge.    Physical Exam  Constitutional: He is oriented to person, place, and time. He appears well-developed.  HENT:  Head: Normocephalic and atraumatic.  Neck: Neck supple.  Pulmonary/Chest: Effort normal. No respiratory distress.  Genitourinary:  Genitourinary Comments: No cva tenderness  Neurological: He is alert and oriented to person, place, and time.  Skin: Skin is warm and dry.  Psychiatric: He has a normal mood and affect. His behavior is normal.  Vitals reviewed.   Laboratory values:   Recent Labs  11/30/16 1826 12/01/16 0150  WBC 11.0*  --   HGB 16.2 15.0  HCT 48.3 43.7    Recent Labs  11/30/16 1826 12/01/16 0150  NA 137 137  K 4.6 4.5  CL 101 105  CO2 25 22  GLUCOSE 115* 140*  BUN 35* 33*  CREATININE 1.41* 1.60*  CALCIUM 9.4 8.8*   No results for input(s): LABPT, INR in the last 72 hours. No results for input(s): LABURIN in the last 72 hours. No results found for this or any previous visit.  Disposition: Home  Discharge  instruction:  You have a ureteral stent in place.  This is a tube that extends from your kidney to your bladder.  This may cause urinary bleeding, burning with urination, and urinary frequency.  Please call our office or present to the ED if you develop fevers >101 or pain which is not able to be controlled with oral pain medications.  You may be given either Flomax and/ or ditropan to help with bladder spasms and stent pain in addition to pain medications.    [address] [place], [place] [address] [phone]   Discharge medications: Allergies as of 12/01/2016   No Known Allergies     Medication List    TAKE these medications   aspirin EC 81 MG tablet Take 81 mg by mouth daily.   atorvastatin 20 MG tablet Commonly known as:  LIPITOR Take 20 mg by mouth daily.   cyanocobalamin 1000 MCG/ML injection Commonly known as:  (VITAMIN B-12) Inject 1,000 mcg into the skin every 14 (fourteen) days.   HYDROcodone-acetaminophen 5-325 MG tablet Commonly known as:  NORCO/VICODIN Take 1-2 tablets by mouth every 4 (four) hours as needed for moderate pain.   PRESERVISION AREDS PO Take 1 tablet by mouth daily.   sulfamethoxazole-trimethoprim 800-160 MG tablet Commonly known as:  BACTRIM DS,SEPTRA DS Take 1 tablet by mouth 2 (two) times daily.       Followup:  Follow-up Information    Hollice Espy, MD In 1 week.  Specialty:  Urology Why:  any provider to discuss stone surgery Contact information: [address] [place] [id number] [phone]

## 2016-12-01 NOTE — Anesthesia Post-op Follow-up Note (Cosign Needed)
Anesthesia QCDR form completed.        

## 2016-12-01 NOTE — Telephone Encounter (Signed)
App made and mailed to patient. Left message for him to cb to give app to.  Marcelino DusterMichelle

## 2016-12-01 NOTE — Brief Op Note (Signed)
11/30/2016 - 12/01/2016  12:31 AM  PATIENT:  Lance Jones  83 y.o. male  PRE-OPERATIVE DIAGNOSIS:  right ureteral stones with UTI   POST-OPERATIVE DIAGNOSIS:  same as preop  PROCEDURE:  Procedure(s): CYSTOSCOPY WITH STENT PLACEMENT (Right)  SURGEON:  Surgeon(s) and Role:    Bjorn Pippin* Shannelle Alguire, MD - Primary  PHYSICIAN ASSISTANT:   ASSISTANTS: none   ANESTHESIA:   general  EBL:  No intake/output data recorded.  BLOOD ADMINISTERED:none  DRAINS: 6 x 26 right JJ stent   LOCAL MEDICATIONS USED:  NONE  SPECIMEN:  No Specimen  DISPOSITION OF SPECIMEN:  N/A  COUNTS:  YES  TOURNIQUET:  * No tourniquets in log *  DICTATION: .Other Dictation: Dictation Number [id number]  PLAN OF CARE: Admit for overnight observation  PATIENT DISPOSITION:  PACU - hemodynamically stable.   Delay start of Pharmacological VTE agent (>24hrs) due to surgical blood loss or risk of bleeding: not applicable

## 2016-12-01 NOTE — Discharge Instructions (Addendum)
You have a ureteral stent in place.  This is a tube that extends from your kidney to your bladder.  This may cause urinary bleeding, burning with urination, and urinary frequency.  Please call our office or present to the ED if you develop fevers >101 or pain which is not able to be controlled with oral pain medications.  You may be given either Flomax and/ or ditropan to help with bladder spasms and stent pain in addition to pain medications.    [facility] [address], [address] [place], [place] [address] [phone] 

## 2016-12-01 NOTE — Telephone Encounter (Signed)
-----   Message from Bjorn PippinJohn Wrenn, MD sent at 12/01/2016 12:39 AM EDT ----- I stented this patient for an 8mm RUPJ stone and possible infection.  He will need ESWL or ureteroscopy in a week or so.   I am admitting him to observation.

## 2016-12-01 NOTE — Care Management Obs Status (Signed)
MEDICARE OBSERVATION STATUS NOTIFICATION   Patient Details  Name: Lance Jones MRN: [id number] Date of Birth: 1927/12/25   Medicare Observation Status Notification Given:  No (Admitted obs less than 24 hours)    Chapman FitchBOWEN, Shahram Alexopoulos T, RN 12/01/2016, 10:12 AM

## 2016-12-01 NOTE — Anesthesia Postprocedure Evaluation (Signed)
Anesthesia Post Note  Patient: Lake BellsJoseph S Reth  Procedure(s) Performed: Procedure(s) (LRB): CYSTOSCOPY WITH STENT PLACEMENT (Right)  Patient location during evaluation: PACU Anesthesia Type: General Level of consciousness: awake and alert and oriented Pain management: pain level controlled Vital Signs Assessment: post-procedure vital signs reviewed and stable Respiratory status: spontaneous breathing Cardiovascular status: blood pressure returned to baseline Anesthetic complications: no     Last Vitals:  Vitals:   12/01/16 0045 12/01/16 0050  BP:    Pulse: 87 88  Resp: (!) 24 (!) 24  Temp:      Last Pain:  Vitals:   12/01/16 0044  TempSrc: Tympanic  PainSc:                  Leeandra Ellerson

## 2016-12-01 NOTE — Progress Notes (Signed)
Discharge paperwork reviewed with pt. Questions answered to satisfaction of pt. IVs removed per order.

## 2016-12-02 ENCOUNTER — Encounter: Payer: Self-pay | Admitting: Urology

## 2016-12-02 LAB — URINE CULTURE

## 2016-12-05 LAB — CULTURE, BLOOD (ROUTINE X 2)
CULTURE: NO GROWTH
Culture: NO GROWTH
SPECIAL REQUESTS: ADEQUATE

## 2016-12-08 ENCOUNTER — Encounter: Payer: Self-pay | Admitting: Urology

## 2016-12-08 ENCOUNTER — Ambulatory Visit: Payer: Medicare Other | Admitting: Urology

## 2016-12-08 VITALS — BP 160/71 | HR 71 | Ht 72.0 in | Wt 182.0 lb

## 2016-12-08 DIAGNOSIS — N201 Calculus of ureter: Secondary | ICD-10-CM | POA: Diagnosis not present

## 2016-12-08 LAB — URINALYSIS, COMPLETE
Bilirubin, UA: NEGATIVE
Glucose, UA: NEGATIVE
NITRITE UA: NEGATIVE
PH UA: 5.5 (ref 5.0–7.5)
Specific Gravity, UA: 1.03 (ref 1.005–1.030)
Urobilinogen, Ur: 1 mg/dL (ref 0.2–1.0)

## 2016-12-08 LAB — MICROSCOPIC EXAMINATION

## 2016-12-08 NOTE — Progress Notes (Signed)
12/08/2016 2:02 PM   Lance Jones Nov 28, 1927 [id number]  Referring provider: [name], MD [address] [facility] [address] [place], [place] [address]  Chief Complaint  Patient presents with  . Nephrolithiasis    discuss surgery    HPI: Male presents today for further management and evaluation of her right mid ureteral stone. He is now status post right stent placement by Dr. Annabell Jones. He was discharged home on Bactrim DS. The patient has recovered well. He continues to have a slight ache, but otherwise is doing well. He denies any fevers or chills. He denies any dysuria. He has had a little bit of urinary frequency.  The patient has no additional stones in his right collecting system. He did not undergo a KUB prior to his appointment today.     PMH: Past Medical History:  Diagnosis Date  . B12 deficiency 02/13/2014  . DVT (deep venous thrombosis) (HCC) 1998   left leg  . Facet arthritis of cervical region (HCC) 08/08/2016  . Hypercholesteremia   . Hyperlipidemia 02/13/2014  . Right ureteral stone 12/01/2016  . Ureteral calculus, right 11/30/2016  . Urinary tract infection 11/30/2016    Surgical History: Past Surgical History:  Procedure Laterality Date  . CYSTOSCOPY WITH STENT PLACEMENT Right 12/01/2016   Procedure: CYSTOSCOPY WITH STENT PLACEMENT;  Surgeon: Lance Pippin, MD;  Location: [facility] ORS;  Service: Urology;  Laterality: Right;  . HERNIA REPAIR      Home Medications:  Allergies as of 12/08/2016   No Known Allergies     Medication List       Accurate as of 12/08/16  2:02 PM. Always use your most recent med list.          aspirin EC 81 MG tablet Take 81 mg by mouth daily.   atorvastatin 20 MG tablet Commonly known as:  LIPITOR Take 20 mg by mouth daily.   cyanocobalamin 1000 MCG/ML injection Commonly known as:  (VITAMIN B-12) Inject 1,000 mcg into the skin every 14 (fourteen) days.   HYDROcodone-acetaminophen 5-325 MG  tablet Commonly known as:  NORCO/VICODIN Take 1-2 tablets by mouth every 4 (four) hours as needed for moderate pain.   PRESERVISION AREDS PO Take 1 tablet by mouth daily.   sulfamethoxazole-trimethoprim 800-160 MG tablet Commonly known as:  BACTRIM DS,SEPTRA DS Take 1 tablet by mouth 2 (two) times daily.       Allergies: No Known Allergies  Family History: No family history on file.  Social History:  reports that he has never smoked. He has never used smokeless tobacco. He reports that he does not drink alcohol or use drugs.  ROS: UROLOGY Frequent Urination?: Yes Hard to postpone urination?: Yes Burning/pain with urination?: Yes Get up at night to urinate?: Yes Leakage of urine?: Yes Urine stream starts and stops?: Yes Trouble starting stream?: Yes Do you have to strain to urinate?: No Blood in urine?: No Urinary tract infection?: No Sexually transmitted disease?: No Injury to kidneys or bladder?: No Painful intercourse?: No Weak stream?: No Erection problems?: No Penile pain?: No  Gastrointestinal Nausea?: No Vomiting?: No Indigestion/heartburn?: No Diarrhea?: No Constipation?: No  Constitutional Fever: No Night sweats?: No Weight loss?: No Fatigue?: No  Skin Skin rash/lesions?: No Itching?: No  Eyes Blurred vision?: No Double vision?: No  Ears/Nose/Throat Sore throat?: No Sinus problems?: No  Hematologic/Lymphatic Swollen glands?: No Easy bruising?: No  Cardiovascular Leg swelling?: No Chest pain?: No  Respiratory Cough?: No Shortness of breath?:  No  Endocrine Excessive thirst?: No  Musculoskeletal Back pain?: No Joint pain?: No  Neurological Headaches?: No Dizziness?: No  Psychologic Depression?: No Anxiety?: No  Physical Exam: BP (!) 160/71   Pulse 71   Ht 6' (1.829 m)   Wt 82.6 kg (182 lb)   BMI 24.68 kg/m   Constitutional:  Alert and oriented, No acute distress. HEENT: [place] AT, moist mucus membranes.  Trachea  midline, no masses. Cardiovascular: No clubbing, cyanosis, or edema. Respiratory: Normal respiratory effort, no increased work of breathing. GI: Abdomen is soft, nontender, nondistended, no abdominal masses GU: No CVA tenderness.  Skin: No rashes, bruises or suspicious lesions. Lymph: No cervical or inguinal adenopathy. Neurologic: Grossly intact, no focal deficits, moving all 4 extremities. Psychiatric: Normal mood and affect.  Laboratory Data: Lab Results  Component Value Date   WBC 11.0 (H) 11/30/2016   HGB 15.0 12/01/2016   HCT 43.7 12/01/2016   MCV 92.4 11/30/2016   PLT 196 11/30/2016    Lab Results  Component Value Date   CREATININE 1.60 (H) 12/01/2016    No results found for: PSA  No results found for: TESTOSTERONE  No results found for: HGBA1C  Urinalysis    Component Value Date/Time   COLORURINE YELLOW (A) 11/30/2016 1826   APPEARANCEUR CLOUDY (A) 11/30/2016 1826   LABSPEC 1.021 11/30/2016 1826   PHURINE 5.0 11/30/2016 1826   GLUCOSEU NEGATIVE 11/30/2016 1826   HGBUR MODERATE (A) 11/30/2016 1826   BILIRUBINUR NEGATIVE 11/30/2016 1826   KETONESUR NEGATIVE 11/30/2016 1826   PROTEINUR 30 (A) 11/30/2016 1826   NITRITE NEGATIVE 11/30/2016 1826   LEUKOCYTESUR SMALL (A) 11/30/2016 1826    Pertinent Imaging: I have reviewed the patient's CT scan which demonstrates a large proximal right-sided ureteral stone with associated hydroureteronephrosis. There are no additional stones in the right collecting system. The patient does have so small amount obstructing stones in the left lower pole.  Assessment & Plan:  At this point, the patient appears to recovered from his procedure and possible UTI. His urine culture demonstrated less than 10,000 colony-forming units, no speciation was performed. He has now completed the Bactrim. He is tolerated the stent reasonably well.  We discussed the treatment options, and I detailed both shockwave lithotripsy as well as  ureteroscopy. Given that he does not have a KUB prior to his appointment today, I'm not sure where the stone currently remains or whether it is truly visible on KUB. However, I did go over the treatment with him including the risks of incomplete fragmentation and Steinstrasse. These fluid cause of her necessitate a second procedure. Following this discussion I discussed ureteroscopy. The patient understands that this requires general anesthesia and instrumentation of the urinary tract. However, this is the best chance for being stone free at the completion of the procedure. He also understands that the stent will be exchanged and likely a stent tether left behind to that he can remove it on his own. The patient has a urine culture that is being sent today for preoperative purposes. We will try to get him scheduled as soon as possible with the first available provider.   1. Ureteral calculus, right  - Urinalysis, Complete - CULTURE, URINE COMPREHENSIVE   No Follow-up on file.  Crist FatHERRICK, Suri Tafolla W, MD  [facility] [address], [address] [place], [place] [address] [phone]

## 2016-12-09 ENCOUNTER — Other Ambulatory Visit: Payer: Self-pay | Admitting: Radiology

## 2016-12-09 ENCOUNTER — Telehealth: Payer: Self-pay | Admitting: Radiology

## 2016-12-09 DIAGNOSIS — N201 Calculus of ureter: Secondary | ICD-10-CM

## 2016-12-09 NOTE — Telephone Encounter (Signed)
Pt scheduled for right ureteroscopy, laser lithotripsy, stent exchange with Dr Apolinar JunesBrandon on 12/15/16. Made pt & his daughter aware of surgery, pre-admit testing appt & to call Friday prior to surgery [facility] & follow up appt with Dr Apolinar JunesBrandon. Advised pt to continue ASA 81mg  per Dr Marlou PorchHerrick. Pt & daughter voice understanding.

## 2016-12-10 ENCOUNTER — Encounter
Admission: RE | Admit: 2016-12-10 | Discharge: 2016-12-10 | Disposition: A | Discharge status: Home / Self Care | Payer: Medicare Other | Source: Ambulatory Visit | Attending: Urology | Admitting: Urology

## 2016-12-10 DIAGNOSIS — E785 Hyperlipidemia, unspecified: Secondary | ICD-10-CM | POA: Diagnosis not present

## 2016-12-10 DIAGNOSIS — R001 Bradycardia, unspecified: Secondary | ICD-10-CM | POA: Insufficient documentation

## 2016-12-10 DIAGNOSIS — Z01810 Encounter for preprocedural cardiovascular examination: Secondary | ICD-10-CM | POA: Insufficient documentation

## 2016-12-10 HISTORY — DX: Personal history of urinary calculi: Z87.442

## 2016-12-10 NOTE — Patient Instructions (Signed)
Your procedure is scheduled on: December 15, 2016 [facility](MONDAY) Report to Same Day Surgery 2nd floor medical mall (Medical Mall Entrance-take elevator on left to 2nd floor.  Check in with surgery information desk.) To find out your arrival time please call [phone] between 1PM - 3PM on December 12, 2016 (FRIDAY)  Remember: Instructions that are not followed completely may result in serious medical risk, up to and including death, or upon the discretion of your surgeon and anesthesiologist your surgery may need to be rescheduled.    _x___ 1. Do not eat food or drink liquids after midnight. No gum chewing or hard candies                          .     __x__ 2. No Alcohol for 24 hours before or after surgery.   __x__3. No Smoking for 24 prior to surgery.   ____  4. Bring all medications with you on the day of surgery if instructed.    __x__ 5. Notify your doctor if there is any change in your medical condition     (cold, fever, infections).     Do not wear jewelry, make-up, hairpins, clips or nail polish.  Do not wear lotions, powders, or perfumes. You may wear deodorant.  Do not shave 48 hours prior to surgery. Men may shave face and neck.  Do not bring valuables to the hospital.    [facility] is not responsible for any belongings or valuables.               Contacts, dentures or bridgework may not be worn into surgery.  Leave your suitcase in the car. After surgery it may be brought to your room.  For patients admitted to the hospital, discharge time is determined by your  treatment team                       Patients discharged the day of surgery will not be allowed to drive home.  You will need someone to drive you home and stay with you the night of your procedure.    Please read over the following fact sheets that you were given:   [facility] Preparing for Surgery and or MRSA Information   _x___ Take the following medication the morning of surgery with a sip of water :  1.  ATORVASTATIN   .    ____Fleets enema or Magnesium Citrate as directed.   ___ Use CHG Soap or sage wipes as directed on instruction sheet   ____ Use inhalers on the day of surgery and bring to hospital day of surgery  ____ Stop Metformin and Janumet 2 days prior to surgery.    ____ Take 1/2 of usual insulin dose the night before surgery and none on the morning surgery     _x___ Follow recommendations from Cardiologist, Pulmonologist or PCP regarding          stopping Aspirin, Coumadin, Plavix ,Eliquis, Effient, or Pradaxa, and Pletal. (ASK DR Apolinar JunesBRANDON ABOUT STOPPING ASPIRIN )  X____Stop Anti-inflammatories such as Advil, Aleve, Ibuprofen, Motrin, Naproxen, Naprosyn, Goodies powders or aspirin products. OK to take Tylenol                            _x___ Stop supplements until after surgery.  But may continue Vitamin D, Vitamin B,and multivitamin (STOP PRESERVISION AREDS NOW, UNTIL AFTER SURGERY )  ____ Bring C-Pap to the hospital.

## 2016-12-11 LAB — CULTURE, URINE COMPREHENSIVE

## 2016-12-14 MED ORDER — CEFAZOLIN SODIUM-DEXTROSE 2-4 GM/100ML-% IV SOLN
2.0000 g | INTRAVENOUS | Status: AC
  Administered 2016-12-15: 2 g via INTRAVENOUS

## 2016-12-15 ENCOUNTER — Ambulatory Visit: Payer: Medicare Other | Admitting: Certified Registered Nurse Anesthetist

## 2016-12-15 ENCOUNTER — Encounter: Payer: Self-pay | Admitting: *Deleted

## 2016-12-15 ENCOUNTER — Ambulatory Visit
Admission: RE | Admit: 2016-12-15 | Discharge: 2016-12-15 | Disposition: A | Discharge status: Home / Self Care | Payer: Medicare Other | Source: Ambulatory Visit | Attending: Urology | Admitting: Urology

## 2016-12-15 ENCOUNTER — Encounter
Admission: RE | Disposition: A | Discharge status: Home / Self Care | Payer: Self-pay | Source: Ambulatory Visit | Attending: Urology

## 2016-12-15 DIAGNOSIS — N201 Calculus of ureter: Secondary | ICD-10-CM | POA: Diagnosis not present

## 2016-12-15 DIAGNOSIS — E538 Deficiency of other specified B group vitamins: Secondary | ICD-10-CM | POA: Diagnosis not present

## 2016-12-15 DIAGNOSIS — E7800 Pure hypercholesterolemia, unspecified: Secondary | ICD-10-CM | POA: Insufficient documentation

## 2016-12-15 DIAGNOSIS — Z79899 Other long term (current) drug therapy: Secondary | ICD-10-CM | POA: Diagnosis not present

## 2016-12-15 DIAGNOSIS — Z87442 Personal history of urinary calculi: Secondary | ICD-10-CM | POA: Insufficient documentation

## 2016-12-15 DIAGNOSIS — Z7982 Long term (current) use of aspirin: Secondary | ICD-10-CM | POA: Insufficient documentation

## 2016-12-15 DIAGNOSIS — N202 Calculus of kidney with calculus of ureter: Secondary | ICD-10-CM | POA: Diagnosis present

## 2016-12-15 HISTORY — PX: CYSTOSCOPY W/ RETROGRADES: SHX1426

## 2016-12-15 HISTORY — PX: CYSTOSCOPY/URETEROSCOPY/HOLMIUM LASER/STENT PLACEMENT: SHX6546

## 2016-12-15 SURGERY — CYSTOSCOPY/URETEROSCOPY/HOLMIUM LASER/STENT PLACEMENT
Anesthesia: General | Laterality: Right | Wound class: Clean Contaminated

## 2016-12-15 MED ORDER — HYDROCODONE-ACETAMINOPHEN 5-325 MG PO TABS: 1.0000 | ORAL_TABLET | ORAL | 0 refills | Status: DC | PRN

## 2016-12-15 MED ORDER — ROCURONIUM BROMIDE 50 MG/5ML IV SOLN
INTRAVENOUS | Status: AC
  Filled 2016-12-15: qty 1

## 2016-12-15 MED ORDER — EPHEDRINE SULFATE 50 MG/ML IJ SOLN
INTRAMUSCULAR | Status: DC | PRN
  Administered 2016-12-15: 10 mg via INTRAVENOUS

## 2016-12-15 MED ORDER — GLYCOPYRROLATE 0.2 MG/ML IJ SOLN
INTRAMUSCULAR | Status: AC
  Filled 2016-12-15: qty 1

## 2016-12-15 MED ORDER — ONDANSETRON HCL 4 MG/2ML IJ SOLN
INTRAMUSCULAR | Status: AC
  Filled 2016-12-15: qty 2

## 2016-12-15 MED ORDER — FENTANYL CITRATE (PF) 100 MCG/2ML IJ SOLN
INTRAMUSCULAR | Status: AC
  Filled 2016-12-15: qty 2

## 2016-12-15 MED ORDER — IOTHALAMATE MEGLUMINE 43 % IV SOLN
INTRAVENOUS | Status: DC | PRN
  Administered 2016-12-15: 15 mL via URETHRAL

## 2016-12-15 MED ORDER — GLYCOPYRROLATE 0.2 MG/ML IJ SOLN
INTRAMUSCULAR | Status: DC | PRN
  Administered 2016-12-15: 0.2 mg via INTRAVENOUS

## 2016-12-15 MED ORDER — ONDANSETRON HCL 4 MG/2ML IJ SOLN: 4.0000 mg | Freq: Once | INTRAMUSCULAR | Status: DC | PRN

## 2016-12-15 MED ORDER — ONDANSETRON HCL 4 MG/2ML IJ SOLN
INTRAMUSCULAR | Status: DC | PRN
Start: 2016-12-15 — End: 2016-12-15
  Administered 2016-12-15: 4 mg via INTRAVENOUS

## 2016-12-15 MED ORDER — ROCURONIUM BROMIDE 100 MG/10ML IV SOLN
INTRAVENOUS | Status: DC | PRN
  Administered 2016-12-15: 5 mg via INTRAVENOUS
  Administered 2016-12-15: 10 mg via INTRAVENOUS
  Administered 2016-12-15: 25 mg via INTRAVENOUS

## 2016-12-15 MED ORDER — PROPOFOL 10 MG/ML IV BOLUS
INTRAVENOUS | Status: DC | PRN
  Administered 2016-12-15: 100 mg via INTRAVENOUS
  Administered 2016-12-15: 30 mg via INTRAVENOUS

## 2016-12-15 MED ORDER — SUCCINYLCHOLINE CHLORIDE 20 MG/ML IJ SOLN
INTRAMUSCULAR | Status: DC | PRN
  Administered 2016-12-15: 100 mg via INTRAVENOUS

## 2016-12-15 MED ORDER — DEXAMETHASONE SODIUM PHOSPHATE 10 MG/ML IJ SOLN
INTRAMUSCULAR | Status: AC
  Filled 2016-12-15: qty 1

## 2016-12-15 MED ORDER — FAMOTIDINE 20 MG PO TABS
ORAL_TABLET | ORAL | Status: AC
  Filled 2016-12-15: qty 1

## 2016-12-15 MED ORDER — DOCUSATE SODIUM 100 MG PO CAPS: 100.0000 mg | ORAL_CAPSULE | Freq: Two times a day (BID) | ORAL | 0 refills | Status: AC

## 2016-12-15 MED ORDER — FAMOTIDINE 20 MG PO TABS
20.0000 mg | ORAL_TABLET | Freq: Once | ORAL | Status: AC
  Administered 2016-12-15: 20 mg via ORAL

## 2016-12-15 MED ORDER — SUGAMMADEX SODIUM 200 MG/2ML IV SOLN
INTRAVENOUS | Status: DC | PRN
  Administered 2016-12-15: 160 mg via INTRAVENOUS

## 2016-12-15 MED ORDER — EPHEDRINE SULFATE 50 MG/ML IJ SOLN
INTRAMUSCULAR | Status: AC
  Filled 2016-12-15: qty 1

## 2016-12-15 MED ORDER — CEFAZOLIN SODIUM-DEXTROSE 2-4 GM/100ML-% IV SOLN
INTRAVENOUS | Status: AC
  Filled 2016-12-15: qty 100

## 2016-12-15 MED ORDER — LACTATED RINGERS IV SOLN
INTRAVENOUS | Status: DC
  Administered 2016-12-15: 11:00:00 via INTRAVENOUS

## 2016-12-15 MED ORDER — FENTANYL CITRATE (PF) 100 MCG/2ML IJ SOLN: 25.0000 ug | INTRAMUSCULAR | Status: DC | PRN

## 2016-12-15 MED ORDER — SUGAMMADEX SODIUM 200 MG/2ML IV SOLN
INTRAVENOUS | Status: AC
  Filled 2016-12-15: qty 2

## 2016-12-15 MED ORDER — SUCCINYLCHOLINE CHLORIDE 20 MG/ML IJ SOLN
INTRAMUSCULAR | Status: AC
  Filled 2016-12-15: qty 1

## 2016-12-15 MED ORDER — FENTANYL CITRATE (PF) 100 MCG/2ML IJ SOLN
INTRAMUSCULAR | Status: DC | PRN
  Administered 2016-12-15: 50 ug via INTRAVENOUS
  Administered 2016-12-15 (×2): 25 ug via INTRAVENOUS

## 2016-12-15 MED ORDER — PROPOFOL 10 MG/ML IV BOLUS
INTRAVENOUS | Status: AC
  Filled 2016-12-15: qty 20

## 2016-12-15 MED ORDER — DEXAMETHASONE SODIUM PHOSPHATE 10 MG/ML IJ SOLN
INTRAMUSCULAR | Status: DC | PRN
  Administered 2016-12-15: 6 mg via INTRAVENOUS

## 2016-12-15 SURGICAL SUPPLY — 32 items
BAG DRAIN CYSTO-URO LG1000N (MISCELLANEOUS) ×3 IMPLANT
BASKET ZERO TIP 1.9FR (BASKET) ×3 IMPLANT
BRUSH SCRUB EZ 1% IODOPHOR (MISCELLANEOUS) ×3 IMPLANT
CATH URETL 5X70 OPEN END (CATHETERS) ×3 IMPLANT
CNTNR SPEC 2.5X3XGRAD LEK (MISCELLANEOUS) ×1
CONRAY 43 FOR UROLOGY 50M (MISCELLANEOUS) ×3 IMPLANT
CONT SPEC 4OZ STER OR WHT (MISCELLANEOUS) ×2
CONTAINER SPEC 2.5X3XGRAD LEK (MISCELLANEOUS) ×1 IMPLANT
DRAPE UTILITY 15X26 TOWEL STRL (DRAPES) ×3 IMPLANT
FIBER LASER LITHO 273 (Laser) ×3 IMPLANT
GLOVE BIO SURGEON STRL SZ 6.5 (GLOVE) ×6 IMPLANT
GLOVE BIO SURGEONS STRL SZ 6.5 (GLOVE) ×3
GOWN STRL REUS W/ TWL LRG LVL3 (GOWN DISPOSABLE) ×3 IMPLANT
GOWN STRL REUS W/TWL LRG LVL3 (GOWN DISPOSABLE) ×6
GUIDEWIRE GREEN .038 145CM (MISCELLANEOUS) ×3 IMPLANT
GUIDEWIRE SUPER STIFF (WIRE) IMPLANT
INFUSOR MANOMETER BAG 3000ML (MISCELLANEOUS) ×3 IMPLANT
INTRODUCER DILATOR DOUBLE (INTRODUCER) ×3 IMPLANT
KIT RM TURNOVER CYSTO AR (KITS) ×3 IMPLANT
PACK CYSTO AR (MISCELLANEOUS) ×3 IMPLANT
SCRUB POVIDONE IODINE 4 OZ (MISCELLANEOUS) ×3 IMPLANT
SENSORWIRE 0.038 NOT ANGLED (WIRE) ×3
SET CYSTO W/LG BORE CLAMP LF (SET/KITS/TRAYS/PACK) ×3 IMPLANT
SHEATH URETERAL 12FR 45CM (SHEATH) ×3 IMPLANT
SHEATH URETERAL 12FRX35CM (MISCELLANEOUS) ×3 IMPLANT
SOL .9 NS 3000ML IRR  AL (IV SOLUTION) ×2
SOL .9 NS 3000ML IRR UROMATIC (IV SOLUTION) ×1 IMPLANT
STENT URET 6FRX24 CONTOUR (STENTS) IMPLANT
STENT URET 6FRX26 CONTOUR (STENTS) ×3 IMPLANT
SURGILUBE 2OZ TUBE FLIPTOP (MISCELLANEOUS) ×3 IMPLANT
WATER STERILE IRR 1000ML POUR (IV SOLUTION) ×3 IMPLANT
WIRE SENSOR 0.038 NOT ANGLED (WIRE) ×1 IMPLANT

## 2016-12-15 NOTE — H&P (View-Only) (Signed)
12/08/2016 2:02 PM   Lance Jones Nov 28, 1927 [id number]  Referring provider: [name], MD [address] [facility] [address] [name], [place] [address]  Chief Complaint  Patient presents with  . Nephrolithiasis    discuss surgery    HPI: Male presents today for further management and evaluation of her right mid ureteral stone. He is now status post right stent placement by Dr. Annabell Howells. He was discharged home on Bactrim DS. The patient has recovered well. He continues to have a slight ache, but otherwise is doing well. He denies any fevers or chills. He denies any dysuria. He has had a little bit of urinary frequency.  The patient has no additional stones in his right collecting system. He did not undergo a KUB prior to his appointment today.     PMH: Past Medical History:  Diagnosis Date  . B12 deficiency 02/13/2014  . DVT (deep venous thrombosis) (HCC) 1998   left leg  . Facet arthritis of cervical region (HCC) 08/08/2016  . Hypercholesteremia   . Hyperlipidemia 02/13/2014  . Right ureteral stone 12/01/2016  . Ureteral calculus, right 11/30/2016  . Urinary tract infection 11/30/2016    Surgical History: Past Surgical History:  Procedure Laterality Date  . CYSTOSCOPY WITH STENT PLACEMENT Right 12/01/2016   Procedure: CYSTOSCOPY WITH STENT PLACEMENT;  Surgeon: Bjorn Pippin, MD;  Location: [facility] ORS;  Service: Urology;  Laterality: Right;  . HERNIA REPAIR      Home Medications:  Allergies as of 12/08/2016   No Known Allergies     Medication List       Accurate as of 12/08/16  2:02 PM. Always use your most recent med list.          aspirin EC 81 MG tablet Take 81 mg by mouth daily.   atorvastatin 20 MG tablet Commonly known as:  LIPITOR Take 20 mg by mouth daily.   cyanocobalamin 1000 MCG/ML injection Commonly known as:  (VITAMIN B-12) Inject 1,000 mcg into the skin every 14 (fourteen) days.   HYDROcodone-acetaminophen 5-325 MG  tablet Commonly known as:  NORCO/VICODIN Take 1-2 tablets by mouth every 4 (four) hours as needed for moderate pain.   PRESERVISION AREDS PO Take 1 tablet by mouth daily.   sulfamethoxazole-trimethoprim 800-160 MG tablet Commonly known as:  BACTRIM DS,SEPTRA DS Take 1 tablet by mouth 2 (two) times daily.       Allergies: No Known Allergies  Family History: No family history on file.  Social History:  reports that he has never smoked. He has never used smokeless tobacco. He reports that he does not drink alcohol or use drugs.  ROS: UROLOGY Frequent Urination?: Yes Hard to postpone urination?: Yes Burning/pain with urination?: Yes Get up at night to urinate?: Yes Leakage of urine?: Yes Urine stream starts and stops?: Yes Trouble starting stream?: Yes Do you have to strain to urinate?: No Blood in urine?: No Urinary tract infection?: No Sexually transmitted disease?: No Injury to kidneys or bladder?: No Painful intercourse?: No Weak stream?: No Erection problems?: No Penile pain?: No  Gastrointestinal Nausea?: No Vomiting?: No Indigestion/heartburn?: No Diarrhea?: No Constipation?: No  Constitutional Fever: No Night sweats?: No Weight loss?: No Fatigue?: No  Skin Skin rash/lesions?: No Itching?: No  Eyes Blurred vision?: No Double vision?: No  Ears/Nose/Throat Sore throat?: No Sinus problems?: No  Hematologic/Lymphatic Swollen glands?: No Easy bruising?: No  Cardiovascular Leg swelling?: No Chest pain?: No  Respiratory Cough?: No Shortness of breath?:  No  Endocrine Excessive thirst?: No  Musculoskeletal Back pain?: No Joint pain?: No  Neurological Headaches?: No Dizziness?: No  Psychologic Depression?: No Anxiety?: No  Physical Exam: BP (!) 160/71   Pulse 71   Ht 6' (1.829 m)   Wt 82.6 kg (182 lb)   BMI 24.68 kg/m   Constitutional:  Alert and oriented, No acute distress. HEENT: [place] AT, moist mucus membranes.  Trachea  midline, no masses. Cardiovascular: No clubbing, cyanosis, or edema. Respiratory: Normal respiratory effort, no increased work of breathing. GI: Abdomen is soft, nontender, nondistended, no abdominal masses GU: No CVA tenderness.  Skin: No rashes, bruises or suspicious lesions. Lymph: No cervical or inguinal adenopathy. Neurologic: Grossly intact, no focal deficits, moving all 4 extremities. Psychiatric: Normal mood and affect.  Laboratory Data: Lab Results  Component Value Date   WBC 11.0 (H) 11/30/2016   HGB 15.0 12/01/2016   HCT 43.7 12/01/2016   MCV 92.4 11/30/2016   PLT 196 11/30/2016    Lab Results  Component Value Date   CREATININE 1.60 (H) 12/01/2016    No results found for: PSA  No results found for: TESTOSTERONE  No results found for: HGBA1C  Urinalysis    Component Value Date/Time   COLORURINE YELLOW (A) 11/30/2016 1826   APPEARANCEUR CLOUDY (A) 11/30/2016 1826   LABSPEC 1.021 11/30/2016 1826   PHURINE 5.0 11/30/2016 1826   GLUCOSEU NEGATIVE 11/30/2016 1826   HGBUR MODERATE (A) 11/30/2016 1826   BILIRUBINUR NEGATIVE 11/30/2016 1826   KETONESUR NEGATIVE 11/30/2016 1826   PROTEINUR 30 (A) 11/30/2016 1826   NITRITE NEGATIVE 11/30/2016 1826   LEUKOCYTESUR SMALL (A) 11/30/2016 1826    Pertinent Imaging: I have reviewed the patient's CT scan which demonstrates a large proximal right-sided ureteral stone with associated hydroureteronephrosis. There are no additional stones in the right collecting system. The patient does have so small amount obstructing stones in the left lower pole.  Assessment & Plan:  At this point, the patient appears to recovered from his procedure and possible UTI. His urine culture demonstrated less than 10,000 colony-forming units, no speciation was performed. He has now completed the Bactrim. He is tolerated the stent reasonably well.  We discussed the treatment options, and I detailed both shockwave lithotripsy as well as  ureteroscopy. Given that he does not have a KUB prior to his appointment today, I'm not sure where the stone currently remains or whether it is truly visible on KUB. However, I did go over the treatment with him including the risks of incomplete fragmentation and Steinstrasse. These fluid cause of her necessitate a second procedure. Following this discussion I discussed ureteroscopy. The patient understands that this requires general anesthesia and instrumentation of the urinary tract. However, this is the best chance for being stone free at the completion of the procedure. He also understands that the stent will be exchanged and likely a stent tether left behind to that he can remove it on his own. The patient has a urine culture that is being sent today for preoperative purposes. We will try to get him scheduled as soon as possible with the first available provider.   1. Ureteral calculus, right  - Urinalysis, Complete - CULTURE, URINE COMPREHENSIVE   No Follow-up on file.  Crist FatHERRICK, Amen Dargis W, MD  [facility] [address], [address] [place], [place] [address] [phone]

## 2016-12-15 NOTE — Op Note (Signed)
Date of procedure: 12/15/16  Preoperative diagnosis:  1. 10 mm right UPJ stone  2. History of sepsis from urinary source  Postoperative diagnosis:  1. Same as above   Procedure: 1. Right ureteroscopy 2. Laser lithotripsy  3. Right ureteral stent exchange 4. Right retrograde pyelogram 5. Right basket extraction of Stone fragment  Surgeon: Vanna ScotlandAshley Anapaula Severt, MD  Anesthesia: General  Complications: None  Intraoperative findings: 10 mm right UPJ stone pushed into a midpole calyx at the time of wire placement or previous stent. All significant stone fragments removed. Stent exchange.  EBL: Minimal   Specimens: Stone fragment   Drains: 6 x 26 French double-J ureteral stent on right   Indication: Lake BellsJoseph S Robards is a 83 y.o. patient with a 10 mm right UPJ stone presenting with urosepsis status post stent placement. He returns today for definitive management of the stone.  After reviewing the management options for treatment, he elected to proceed with the above surgical procedure(s). We have discussed the potential benefits and risks of the procedure, side effects of the proposed treatment, the likelihood of the patient achieving the goals of the procedure, and any potential problems that might occur during the procedure or recuperation. Informed consent has been obtained.  Description of procedure:  The patient was taken to the operating room and general anesthesia was induced.  The patient was placed in the dorsal lithotomy position, prepped and draped in the usual sterile fashion, and preoperative antibiotics were administered. A preoperative time-out was performed.   21 [place] scope was advanced per urethra into the bladder. Attention surgeon the right ureteral orifice from which a ureteral stent was seen emanating. The distal coil was grasped using stent graspers and brought to level of the urethral meatus. I then attempted to cannula the stent, but upon withdrawing the stent, loss  access the ureter. It was able to advance a sensor wire up to level of the kidney. Upon advancing the wire, I did note that the calcification shadow was seen within the midpole calyx presumably the obstructing stone in question which had migrated into a midpole with manipulation. A second Super Stiff wire was then introduced up to level of the kidney using a dual lumen access sheath. Prior to advancing this wire, did perform a retrograde pyelogram outlining a decompressed ureter and collecting system.  The sensor wire was then snapped in place as a safety wire. The superstiff was used as a working wire. A dual lumen Cook 12/14 ureteral access sheath was then advanced up to level of the proximal ureter over the Super Stiff wire without difficulty. The inner lumen was removed. A 8 French dual-lumen flexible ureteroscope was then introduced up to level of the kidney and directed into the midpole calyx where the stone was identified. A 270  laser fire was then brought in and using settings of 0.2 J and 40 Hz, the stone was fragmented into very tiny particles. At tip less 1.9 [place] nitinol basket was then used to remove the majority of these fragments. All residual fragments were tiny, less than the tip of the size of the laser fiber. A final retrograde pyelogram was performed which showed no evidence of extravasation. This created a roadmap of the kidney. Each and every calyx was then directly visualized to ensure that there is no residual significant stone burden. Once this was achieved, the scope was backed down the length of the ureter. The ureter was carefully inspected along the way to ensure that there was no  ureteral injury or residual stone burden. The safety wire was then backloaded over a rigid cystoscope. A 6 x 26 French double-J ureteral stent was then advanced over the wire up to level of the kidney. The wire was withdrawn until the upper coil of the stent was seen looping over an upper pole calyx. The  wire was then fully withdrawn and a full coil was noted within the bladder. The bladder was then drained. The scope was removed. The patient was then cleaned and dry, repositioned supine position, reversed from anesthesia, taken to the PACU in stable condition.  Plan: Patient will return next week for cystoscopy, stent removal.     Vanna Scotland, M.D.

## 2016-12-15 NOTE — Anesthesia Preprocedure Evaluation (Signed)
Anesthesia Evaluation  Patient identified by MRN, date of birth, ID band Patient awake    Reviewed: Allergy & Precautions, NPO status , Patient's Chart, lab work & pertinent test results  Airway Mallampati: II  TM Distance: >3 FB     Dental no notable dental hx.    Pulmonary neg pulmonary ROS,    Pulmonary exam normal        Cardiovascular negative cardio ROS Normal cardiovascular exam     Neuro/Psych negative neurological ROS  negative psych ROS   GI/Hepatic negative GI ROS, Neg liver ROS,   Endo/Other  negative endocrine ROS  Renal/GU Renal InsufficiencyRenal disease     Musculoskeletal  (+) Arthritis ,   Abdominal Normal abdominal exam  (+)   Peds negative pediatric ROS (+)  Hematology negative hematology ROS (+)   Anesthesia Other Findings History reviewed. No pertinent past medical history.  Reproductive/Obstetrics                             Anesthesia Physical  Anesthesia Plan  ASA: II and emergent  Anesthesia Plan: General   Post-op Pain Management:    Induction:   PONV Risk Score and Plan: 3 and Ondansetron, Dexamethasone, Propofol, Midazolam and Treatment may vary due to age or medical condition  Airway Management Planned: Oral ETT  Additional Equipment:   Intra-op Plan:   Post-operative Plan: Extubation in OR  Informed Consent: I have reviewed the patients History and Physical, chart, labs and discussed the procedure including the risks, benefits and alternatives for the proposed anesthesia with the patient or authorized representative who has indicated his/her understanding and acceptance.   Dental advisory given  Plan Discussed with: CRNA and Surgeon  Anesthesia Plan Comments:         Anesthesia Quick Evaluation

## 2016-12-15 NOTE — Discharge Instructions (Signed)
You have a ureteral stent in place.  This is a tube that extends from your kidney to your bladder.  This may cause urinary bleeding, burning with urination, and urinary frequency.  Please call our office or present to the ED if you develop fevers >101 or pain which is not able to be controlled with oral pain medications.  You may be given either Flomax and/ or ditropan to help with bladder spasms and stent pain in addition to pain medications.   ° °[facility] °[address], [address] °[place], [place] [address] °[phone] ° ° ° °AMBULATORY SURGERY  °DISCHARGE INSTRUCTIONS ° ° °1) The drugs that you were given will stay in your system until tomorrow so for the next 24 hours you should not: ° °A) Drive an automobile °B) Make any legal decisions °C) Drink any alcoholic beverage ° ° °2) You may resume regular meals tomorrow.  Today it is better to start with liquids and gradually work up to solid foods. ° °You may eat anything you prefer, but it is better to start with liquids, then soup and crackers, and gradually work up to solid foods. ° ° °3) Please notify your doctor immediately if you have any unusual bleeding, trouble breathing, redness and pain at the surgery site, drainage, fever, or pain not relieved by medication. ° ° ° °4) Additional Instructions: ° ° ° ° ° ° ° °Please contact your physician with any problems or Same Day Surgery at [phone], Monday through Friday 6 am to 4 pm, or  at [facility] number at [phone]. °

## 2016-12-15 NOTE — Anesthesia Post-op Follow-up Note (Cosign Needed)
Anesthesia QCDR form completed.        

## 2016-12-15 NOTE — Interval H&P Note (Signed)
History and Physical Interval Note:  12/15/2016 11:30 AM  Lance Jones  has presented today for surgery, with the diagnosis of RIGHT URETERAL STONE  The various methods of treatment have been discussed with the patient and family. After consideration of risks, benefits and other options for treatment, the patient has consented to  Procedure(s): CYSTOSCOPY/URETEROSCOPY/HOLMIUM LASER/STENT EXCHANGE (Right) CYSTOSCOPY WITH RETROGRADE PYELOGRAM (Right) as a surgical intervention .  The patient's history has been reviewed, patient examined, no change in status, stable for surgery.  I have reviewed the patient's chart and labs.  Questions were answered to the patient's satisfaction.    RRR CTAB  Vanna ScotlandAshley Laksh Hinners

## 2016-12-15 NOTE — Anesthesia Procedure Notes (Signed)
Procedure Name: Intubation Date/Time: 12/15/2016 12:02 PM Performed by: Darlyne Russian Pre-anesthesia Checklist: Patient identified, Emergency Drugs available, Suction available, Patient being monitored and Timeout performed Patient Re-evaluated:Patient Re-evaluated prior to induction Oxygen Delivery Method: Circle system utilized Preoxygenation: Pre-oxygenation with 100% oxygen Induction Type: IV induction Ventilation: Mask ventilation without difficulty and Oral airway inserted - appropriate to patient size Laryngoscope Size: Mac and 4 Grade View: Grade I Tube type: Oral Tube size: 7.5 mm Number of attempts: 1 Airway Equipment and Method: Stylet Placement Confirmation: ETT inserted through vocal cords under direct vision,  positive ETCO2 and breath sounds checked- equal and bilateral Secured at: 23 cm Tube secured with: Tape Dental Injury: Teeth and Oropharynx as per pre-operative assessment

## 2016-12-15 NOTE — Transfer of Care (Signed)
Immediate Anesthesia Transfer of Care Note  Patient: Lance Jones  Procedure(s) Performed: Procedure(s): CYSTOSCOPY/URETEROSCOPY/HOLMIUM LASER/STENT EXCHANGE (Right) CYSTOSCOPY WITH RETROGRADE PYELOGRAM (Right)  Patient Location: PACU  Anesthesia Type:General  Level of Consciousness: awake, alert , oriented and patient cooperative  Airway & Oxygen Therapy: Patient Spontanous Breathing and Patient connected to nasal cannula oxygen  Post-op Assessment: Report given to RN and Post -op Vital signs reviewed and stable  Post vital signs: Reviewed and stable  Last Vitals:  Vitals:   12/15/16 1056 12/15/16 1305  BP: (!) 145/77 (!) 163/80  Pulse: (!) 57 80  Resp: 18 18  Temp: 36.5 C 36.8 C    Last Pain:  Vitals:   12/15/16 1305  TempSrc: Tympanic  PainSc: 0-No pain         Complications: No apparent anesthesia complications

## 2016-12-16 ENCOUNTER — Encounter: Payer: Self-pay | Admitting: Urology

## 2016-12-16 NOTE — Anesthesia Postprocedure Evaluation (Signed)
Anesthesia Post Note  Patient: Lance Jones  Procedure(s) Performed: Procedure(s) (LRB): CYSTOSCOPY/URETEROSCOPY/HOLMIUM LASER/STENT EXCHANGE (Right) CYSTOSCOPY WITH RETROGRADE PYELOGRAM (Right)  Patient location during evaluation: PACU Anesthesia Type: General Level of consciousness: awake and alert Pain management: pain level controlled Vital Signs Assessment: post-procedure vital signs reviewed and stable Respiratory status: spontaneous breathing, nonlabored ventilation, respiratory function stable and patient connected to nasal cannula oxygen Cardiovascular status: blood pressure returned to baseline and stable Postop Assessment: no signs of nausea or vomiting Anesthetic complications: no     Last Vitals:  Vitals:   12/15/16 1401 12/15/16 1420  BP: (!) 170/72 (!) 182/84  Pulse: 65 68  Resp: 16 16  Temp: (!) 36.1 C     Last Pain:  Vitals:   12/15/16 1401  TempSrc: Tympanic  PainSc:                  Lenard SimmerAndrew Shanta Hartner

## 2016-12-22 LAB — STONE ANALYSIS
CA OXALATE, DIHYDRATE: 80 %
CA OXALATE, MONOHYDR.: 15 %
CA PHOS CRY STONE QL IR: 5 %
STONE WEIGHT KSTONE: 11 mg

## 2016-12-24 ENCOUNTER — Ambulatory Visit (INDEPENDENT_AMBULATORY_CARE_PROVIDER_SITE_OTHER): Payer: Medicare Other | Admitting: Urology

## 2016-12-24 ENCOUNTER — Encounter: Payer: Self-pay | Admitting: Urology

## 2016-12-24 VITALS — BP 153/68 | HR 76 | Ht 72.0 in | Wt 182.0 lb

## 2016-12-24 DIAGNOSIS — N201 Calculus of ureter: Secondary | ICD-10-CM | POA: Diagnosis not present

## 2016-12-24 DIAGNOSIS — N3000 Acute cystitis without hematuria: Secondary | ICD-10-CM

## 2016-12-24 LAB — MICROSCOPIC EXAMINATION

## 2016-12-24 LAB — URINALYSIS, COMPLETE
BILIRUBIN UA: NEGATIVE
GLUCOSE, UA: NEGATIVE
KETONES UA: NEGATIVE
NITRITE UA: POSITIVE — AB
UUROB: 1 mg/dL (ref 0.2–1.0)
pH, UA: 6 (ref 5.0–7.5)

## 2016-12-24 MED ORDER — CIPROFLOXACIN HCL 500 MG PO TABS: 500.0000 mg | ORAL_TABLET | Freq: Once | ORAL | Status: DC

## 2016-12-24 MED ORDER — SULFAMETHOXAZOLE-TRIMETHOPRIM 800-160 MG PO TABS: 1.0000 | ORAL_TABLET | Freq: Two times a day (BID) | ORAL | 0 refills | Status: DC

## 2016-12-24 MED ORDER — LIDOCAINE HCL 2 % EX GEL: 1.0000 "application " | Freq: Once | CUTANEOUS | Status: DC

## 2016-12-24 MED ORDER — CIPROFLOXACIN HCL 500 MG PO TABS
500.0000 mg | ORAL_TABLET | Freq: Once | ORAL | Status: AC
  Administered 2016-12-24: 500 mg via ORAL

## 2016-12-24 NOTE — Progress Notes (Signed)
12/24/2016 9:34 AM   Lance Jones Feb 17, 1928 [id number]  Referring provider: Carmin Richmondavis, James W, MD [address] [facility] [address] [place], [place] [address]  Chief Complaint  Patient presents with  . Cysto Stent Removal    HPI: 83 year old male with a 10 mm right UPJ stone status post ureteroscopy on 12/15/2016 to return today for cystoscopy, stent removal.  He denies any fevers, chills, or any other symptoms. He's tolerated the stent well.  UA today is frankly positive (+ nitrites not on pyridium), highly suspicious for infection.  He is accompanied today by his daughter.   PMH: Past Medical History:  Diagnosis Date  . B12 deficiency 02/13/2014  . DVT (deep venous thrombosis) (HCC) 1998   left leg  . Facet arthritis of cervical region (HCC) 08/08/2016  . History of kidney stones   . Hypercholesteremia   . Hyperlipidemia 02/13/2014  . Right ureteral stone 12/01/2016  . Ureteral calculus, right 11/30/2016  . Urinary tract infection 11/30/2016    Surgical History: Past Surgical History:  Procedure Laterality Date  . CYSTOSCOPY W/ RETROGRADES Right 12/15/2016   Procedure: CYSTOSCOPY WITH RETROGRADE PYELOGRAM;  Surgeon: Vanna ScotlandBrandon, Milda Lindvall, MD;  Location: [facility] ORS;  Service: Urology;  Laterality: Right;  . CYSTOSCOPY WITH STENT PLACEMENT Right 12/01/2016   Procedure: CYSTOSCOPY WITH STENT PLACEMENT;  Surgeon: Bjorn PippinWrenn, John, MD;  Location: [facility] ORS;  Service: Urology;  Laterality: Right;  . CYSTOSCOPY/URETEROSCOPY/HOLMIUM LASER/STENT PLACEMENT Right 12/15/2016   Procedure: CYSTOSCOPY/URETEROSCOPY/HOLMIUM LASER/STENT EXCHANGE;  Surgeon: Vanna ScotlandBrandon, Kyanne Rials, MD;  Location: [facility] OR[facility];  Service: Urology;  Laterality: Right;  . EYE SURGERY Left    Cataract Extraction with IOL  . FRACTURE SURGERY Left 1970   Leg  . HERNIA REPAIR Left 2008   Inguinal Hernia Repair    Home Medications:  Allergies as of 12/24/2016   No Known Allergies     Medication List         Accurate as of 12/24/16  9:34 AM. Always use your most recent med list.          ARTIFICIAL TEARS OP Place 2 drops into both eyes 2 (two) times daily as needed (dry eyes).   aspirin EC 81 MG tablet Take 81 mg by mouth daily.   atorvastatin 20 MG tablet Commonly known as:  LIPITOR Take 20 mg by mouth daily.   cyanocobalamin 1000 MCG/ML injection Commonly known as:  (VITAMIN B-12) Inject 1,000 mcg into the skin every 30 (thirty) days.   docusate sodium 100 MG capsule Commonly known as:  COLACE Take 1 capsule (100 mg total) by mouth 2 (two) times daily.   PRESERVISION AREDS PO Take 1 tablet by mouth daily.   sulfamethoxazole-trimethoprim 800-160 MG tablet Commonly known as:  BACTRIM DS,SEPTRA DS Take 1 tablet by mouth every 12 (twelve) hours.       Allergies: No Known Allergies  Family History: No family history on file.  Social History:  reports that he has never smoked. He has never used smokeless tobacco. He reports that he does not drink alcohol or use drugs.   Physical Exam: BP (!) 153/68   Pulse 76   Ht 6' (1.829 m)   Wt 182 lb (82.6 kg)   BMI 24.68 kg/m   Constitutional:  Alert and oriented, No acute distress. Accompanied by daughter today. HEENT: [place] AT, moist mucus membranes.  Trachea midline, no masses. Cardiovascular: No clubbing, cyanosis, or edema. Respiratory: Normal respiratory effort, no increased work of breathing. GI: Abdomen is  soft, nontender, nondistended, no abdominal masses GU: No CVA tenderness. Skin: No rashes, bruises or suspicious lesions. Neurologic: Grossly intact, no focal deficits, moving all 4 extremities. Psychiatric: Normal mood and affect.  Laboratory Data: Lab Results  Component Value Date   WBC 11.0 (H) 11/30/2016   HGB 15.0 12/01/2016   HCT 43.7 12/01/2016   MCV 92.4 11/30/2016   PLT 196 11/30/2016    Lab Results  Component Value Date   CREATININE 1.60 (H) 12/01/2016    Urinalysis Component     Latest Ref Rng  & Units 12/24/2016  Specific Gravity, UA     1.005 - 1.030 >1.030 (H)  pH, UA     5.0 - 7.5 6.0  Color, UA     Yellow Red (A)  Appearance Ur     Clear Cloudy (A)  Leukocytes, UA     Negative 1+ (A)  Protein, UA     Negative/Trace 3+ (A)  Glucose     Negative Negative  Ketones, UA     Negative Negative  RBC, UA     Negative 3+ (A)  Bilirubin, UA     Negative Negative  Urobilinogen, Ur     0.2 - 1.0 mg/dL 1.0  Nitrite, UA     Negative Positive (A)  Microscopic Examination      See below:    Pertinent Imaging: No new imaging  Assessment & Plan:    1. Acute cystitis without hematuria Will go ahead and treat with Bactrim DS UCx Sent today, will adjust as needed Clinically well  2. Ureteral calculus, right Cystoscopy, stent removal deferred until infection is controlled with decreased bacterial load - Urinalysis, Complete - CULTURE, URINE COMPREHENSIVE  Both he and his daughter are agreeable to this plan. They will return next week for cystoscopy, stent removal.  Return for cysto/ stent removal next week (reschedule).  Vanna ScotlandAshley Honey Zakarian, MD  [facility] [address], [address] [place], [place] [address] [phone]

## 2016-12-27 LAB — CULTURE, URINE COMPREHENSIVE

## 2016-12-31 ENCOUNTER — Ambulatory Visit (INDEPENDENT_AMBULATORY_CARE_PROVIDER_SITE_OTHER): Payer: Medicare Other | Admitting: Urology

## 2016-12-31 VITALS — BP 166/71 | HR 69 | Ht 72.0 in | Wt 182.0 lb

## 2016-12-31 DIAGNOSIS — N201 Calculus of ureter: Secondary | ICD-10-CM | POA: Diagnosis not present

## 2017-01-01 ENCOUNTER — Encounter: Payer: Self-pay | Admitting: Urology

## 2017-01-01 LAB — URINALYSIS, COMPLETE
BILIRUBIN UA: NEGATIVE
GLUCOSE, UA: NEGATIVE
Nitrite, UA: NEGATIVE
PH UA: 5.5 (ref 5.0–7.5)
UUROB: 1 mg/dL (ref 0.2–1.0)

## 2017-01-01 LAB — MICROSCOPIC EXAMINATION
Epithelial Cells (non renal): NONE SEEN /hpf (ref 0–10)
RBC, UA: >30 /hpf — ABNORMAL HIGH (ref 0–?)

## 2017-01-01 MED ORDER — LIDOCAINE HCL 2 % EX GEL
1.0000 "application " | Freq: Once | CUTANEOUS | Status: AC
  Administered 2017-01-01: 1 via URETHRAL

## 2017-01-01 NOTE — Progress Notes (Signed)
   01/01/17  CC:  Chief Complaint  Patient presents with  . Cysto Stent Removal    HPI: 83 yo M with M with 10 mm R UPJ stone s/p URS on 12/15/16.  He was treated for possible UTI last week but has been asymptomatic.  UA today improved.  No complaints.    Blood pressure (!) 166/71, pulse 69, height 6' (1.829 m), weight 182 lb (82.6 kg). NED. A&Ox3.   No respiratory distress   Abd soft, NT, ND Normal phallus with bilateral descended testicles  Cystoscopy/ Stent removal procedure  Patient identification was confirmed, informed consent was obtained, and patient was prepped using Betadine solution.  Lidocaine jelly was administered per urethral meatus.    Preoperative abx where received prior to procedure.    Procedure: - Flexible cystoscope introduced, without any difficulty.   - Thorough search of the bladder revealed:    normal urethral meatus  Stent seen emanating from right ureteral orifice, grasped with stent graspers, and removed in entirety.    Moderate debris in bladder limiting visualization.  Post-Procedure: - Patient tolerated the procedure well   Assessment/ Plan:   1. Ureteral calculus, right S/p uncomplicated stent removal Warning symptoms reviewed F/u in 4 weeks with RUS to r/o iatrogenic hydronephrosis - Urinalysis, Complete - lidocaine (XYLOCAINE) 2 % jelly 1 application; Place 1 application into the urethra once.  Return in about 4 weeks (around 01/28/2017) for RUS.   Vanna ScotlandAshley Janissa Bertram, MD

## 2017-01-14 ENCOUNTER — Ambulatory Visit
Admission: RE | Admit: 2017-01-14 | Discharge: 2017-01-14 | Disposition: A | Discharge status: Home / Self Care | Payer: Medicare Other | Source: Ambulatory Visit | Attending: Urology | Admitting: Urology

## 2017-01-14 ENCOUNTER — Ambulatory Visit: Payer: Medicare Other | Admitting: Urology

## 2017-01-14 DIAGNOSIS — Z87442 Personal history of urinary calculi: Secondary | ICD-10-CM | POA: Diagnosis not present

## 2017-01-14 DIAGNOSIS — N201 Calculus of ureter: Secondary | ICD-10-CM

## 2017-01-14 DIAGNOSIS — N323 Diverticulum of bladder: Secondary | ICD-10-CM | POA: Insufficient documentation

## 2017-01-14 DIAGNOSIS — Z09 Encounter for follow-up examination after completed treatment for conditions other than malignant neoplasm: Secondary | ICD-10-CM | POA: Diagnosis not present

## 2017-01-27 ENCOUNTER — Ambulatory Visit: Payer: Medicare Other | Admitting: Urology

## 2017-01-27 DIAGNOSIS — N201 Calculus of ureter: Secondary | ICD-10-CM

## 2017-01-27 NOTE — Progress Notes (Signed)
01/27/2017 6:18 PM   Lance BellsJoseph S Danker 08-12-1927 [id number]  Referring provider: Carmin Richmondavis, James W, MD [address] [facility] [address] [place], [place] [id number]  Chief Complaint  Patient presents with  . Follow-up    ureteral stone    HPI:  83 year old male with an 8 mm right  Proximal ureteral stone status post stent placement followed by staged ureteroscopy for definitive management of his stone. He was treated for UTI on several occasions preop and postop.  Stent was subsequently removed. Follow-up renal ultrasound showed resolution of his hydronephrosis.  Stone analysis consistent with 80% calcium oxalate dihydrate, 15% calcium oxalate monohydrate, 5% calcium phosphate.  No previous stone episodes.  He takes primarily tea throughout the day and very little water. He does drink an occasional sprite or 7-up.    Today, he denies any urinary symptoms or flank pain. He is doing very well.   PMH: Past Medical History:  Diagnosis Date  . B12 deficiency 02/13/2014  . DVT (deep venous thrombosis) (HCC) 1998   left leg  . Facet arthritis of cervical region (HCC) 08/08/2016  . History of kidney stones   . Hypercholesteremia   . Hyperlipidemia 02/13/2014  . Right ureteral stone 12/01/2016  . Ureteral calculus, right 11/30/2016  . Urinary tract infection 11/30/2016    Surgical History: Past Surgical History:  Procedure Laterality Date  . CYSTOSCOPY W/ RETROGRADES Right 12/15/2016   Procedure: CYSTOSCOPY WITH RETROGRADE PYELOGRAM;  Surgeon: Vanna ScotlandBrandon, Joniel Graumann, MD;  Location: [facility];  Service: Urology;  Laterality: Right;  . CYSTOSCOPY WITH STENT PLACEMENT Right 12/01/2016   Procedure: CYSTOSCOPY WITH STENT PLACEMENT;  Surgeon: Bjorn PippinWrenn, John, MD;  Location: [facility];  Service: Urology;  Laterality: Right;  . CYSTOSCOPY/URETEROSCOPY/HOLMIUM LASER/STENT PLACEMENT Right 12/15/2016   Procedure: CYSTOSCOPY/URETEROSCOPY/HOLMIUM LASER/STENT EXCHANGE;  Surgeon: Vanna ScotlandBrandon, Nicosha Struve,  MD;  Location: [facility];  Service: Urology;  Laterality: Right;  . EYE SURGERY Left    Cataract Extraction with IOL  . FRACTURE SURGERY Left 1970   Leg  . HERNIA REPAIR Left 2008   Inguinal Hernia Repair    Home Medications:  Allergies as of 01/27/2017   No Known Allergies     Medication List       Accurate as of 01/27/17  6:18 PM. Always use your most recent med list.          ARTIFICIAL TEARS OP Place 2 drops into both eyes 2 (two) times daily as needed (dry eyes).   aspirin EC 81 MG tablet Take 81 mg by mouth daily.   atorvastatin 20 MG tablet Commonly known as:  LIPITOR Take 20 mg by mouth daily.   cyanocobalamin 1000 MCG/ML injection Commonly known as:  (VITAMIN B-12) Inject 1,000 mcg into the skin every 30 (thirty) days.   docusate sodium 100 MG capsule Commonly known as:  COLACE Take 1 capsule (100 mg total) by mouth 2 (two) times daily.   PRESERVISION AREDS PO Take 1 tablet by mouth daily.       Allergies: No Known Allergies  Family History: No family history on file.  Social History:  reports that he has never smoked. He has never used smokeless tobacco. He reports that he does not drink alcohol or use drugs.  ROS: UROLOGY Frequent Urination?: No Hard to postpone urination?: No Burning/pain with urination?: No Get up at night to urinate?: No Leakage of urine?: No Urine stream starts and stops?: No Trouble starting stream?: No Do you have to strain to urinate?:  No Blood in urine?: No Urinary tract infection?: No Sexually transmitted disease?: No Injury to kidneys or bladder?: No Painful intercourse?: No Weak stream?: No Erection problems?: No Penile pain?: No  Gastrointestinal Nausea?: No Vomiting?: No Indigestion/heartburn?: No Diarrhea?: No Constipation?: No  Constitutional Fever: No Night sweats?: No Weight loss?: No Fatigue?: No  Skin Skin rash/lesions?: No Itching?: No  Eyes Blurred vision?: No Double vision?:  No  Ears/Nose/Throat Sore throat?: No Sinus problems?: No  Hematologic/Lymphatic Swollen glands?: No Easy bruising?: No  Cardiovascular Leg swelling?: No Chest pain?: No  Respiratory Cough?: No Shortness of breath?: No  Endocrine Excessive thirst?: No  Musculoskeletal Back pain?: No Joint pain?: No  Neurological Headaches?: No Dizziness?: No  Psychologic Depression?: No Anxiety?: No  Physical Exam: Vitals reviewed, see epic Constitutional:  Alert and oriented, No acute distress.  Accompanied by daughter. HEENT:  AT, moist mucus membranes.  Trachea midline, no masses. Cardiovascular: No clubbing, cyanosis, or edema. Respiratory: Normal respiratory effort, no increased work of breathing. GI: Abdomen is soft, nontender, nondistended, no abdominal masses GU: No CVA tenderness.  Skin: No rashes, bruises or suspicious lesions. Neurologic: Grossly intact, no focal deficits, moving all 4 extremities. Psychiatric: Normal mood and affect.  Laboratory Data: Lab Results  Component Value Date   WBC 11.0 (H) 11/30/2016   HGB 15.0 12/01/2016   HCT 43.7 12/01/2016   MCV 92.4 11/30/2016   PLT 196 11/30/2016    Lab Results  Component Value Date   CREATININE 1.60 (H) 12/01/2016    Urinalysis N/a  Pertinent Imaging: CLINICAL DATA:  She of prior surgery for renal calculi.  EXAM: RENAL / URINARY TRACT ULTRASOUND COMPLETE  COMPARISON:  CT 11/30/2016 .  FINDINGS: Right Kidney:  Length: 10.6 cm. Echogenicity within normal limits. Renal cortical thinning. No mass or hydronephrosis visualized. Interim resolution of previously identified right hydronephrosis.  Left Kidney:  Length: 10.6 cm. Echogenicity within normal limits. Renal cortical thinning. Left parapelvic cysts again noted. No mass or hydronephrosis visualized.  Bladder:  Prominent bladder diverticulum.  Bladder is nondistended.  IMPRESSION: 1. Interim resolution of right  hydronephrosis. Left parapelvic cysts again noted.  2.  Bilateral renal cortical thinning.  3. Bladder diverticulum.  The bladder is nondistended.   Electronically Signed   By: Maisie Fus  Register   On: 01/14/2017 15:03  Renal ultrasound personally reviewed.  Assessment & Plan:    1. Ureteral calculus, right S/p uncomplicated right ureteroscopy No residual silent hydronephrosis Stone analysis reviewed We discussed general stone prevention techniques including drinking plenty water with goal of producing 2.5 L urine daily, increased citric acid intake, avoidance of high oxalate containing foods, and decreased salt intake.  Information about dietary recommendations given today.   F/u prn  Vanna Scotland, MD  [facility] [address] [place], [place] [address] [phone]

## 2017-01-27 NOTE — Patient Instructions (Signed)
Dietary Guidelines to Help Prevent Kidney Stones Kidney stones are deposits of minerals and salts that form inside your kidneys. Your risk of developing kidney stones may be greater depending on your diet, your lifestyle, the medicines you take, and whether you have certain medical conditions. Most people can reduce their chances of developing kidney stones by following the instructions below. Depending on your overall health and the type of kidney stones you tend to develop, your dietitian may give you more specific instructions. What are tips for following this plan? Reading food labels   Choose foods with "no salt added" or "low-salt" labels. Limit your sodium intake to less than 1500 mg per day.  Choose foods with calcium for each meal and snack. Try to eat about 300 mg of calcium at each meal. Foods that contain 200-500 mg of calcium per serving include:  8 oz (237 ml) of milk, fortified nondairy milk, and fortified fruit juice.  8 oz (237 ml) of kefir, yogurt, and soy yogurt.  4 oz (118 ml) of tofu.  1 oz of cheese.  1 cup (300 g) of dried figs.  1 cup (91 g) of cooked broccoli.  1-3 oz can of sardines or mackerel.  Most people need 1000 to 1500 mg of calcium each day. Talk to your dietitian about how much calcium is recommended for you. Shopping   Buy plenty of fresh fruits and vegetables. Most people do not need to avoid fruits and vegetables, even if they contain nutrients that may contribute to kidney stones.  When shopping for convenience foods, choose:  Whole pieces of fruit.  Premade salads with dressing on the side.  Low-fat fruit and yogurt smoothies.  Avoid buying frozen meals or prepared deli foods.  Look for foods with live cultures, such as yogurt and kefir. Cooking   Do not add salt to food when cooking. Place a salt shaker on the table and allow each person to add his or her own salt to taste.  Use vegetable protein, such as beans, textured vegetable  protein (TVP), or tofu instead of meat in pasta, casseroles, and soups. Meal planning   Eat less salt, if told by your dietitian. To do this:  Avoid eating processed or premade food.  Avoid eating fast food.  Eat less animal protein, including cheese, meat, poultry, or fish, if told by your dietitian. To do this:  Limit the number of times you have meat, poultry, fish, or cheese each week. Eat a diet free of meat at least 2 days a week.  Eat only one serving each day of meat, poultry, fish, or seafood.  When you prepare animal protein, cut pieces into small portion sizes. For most meat and fish, one serving is about the size of one deck of cards.  Eat at least 5 servings of fresh fruits and vegetables each day. To do this:  Keep fruits and vegetables on hand for snacks.  Eat 1 piece of fruit or a handful of berries with breakfast.  Have a salad and fruit at lunch.  Have two kinds of vegetables at dinner.  Limit foods that are high in a substance called oxalate. These include:  Spinach.  Rhubarb.  Beets.  Potato chips and french fries.  Nuts.  If you regularly take a diuretic medicine, make sure to eat at least 1-2 fruits or vegetables high in potassium each day. These include:  Avocado.  Banana.  Orange, prune, carrot, or tomato juice.  Baked potato.  Cabbage.    Beans and split peas. General instructions   Drink enough fluid to keep your urine clear or pale yellow. This is the most important thing you can do.  Talk to your health care provider and dietitian about taking daily supplements. Depending on your health and the cause of your kidney stones, you may be advised:  Not to take supplements with vitamin C.  To take a calcium supplement.  To take a daily probiotic supplement.  To take other supplements such as magnesium, fish oil, or vitamin B6.  Take all medicines and supplements as told by your health care provider.  Limit alcohol intake to no  more than 1 drink a day for nonpregnant women and 2 drinks a day for men. One drink equals 12 oz of beer, 5 oz of wine, or 1 oz of hard liquor.  Lose weight if told by your health care provider. Work with your dietitian to find strategies and an eating plan that works best for you. What foods are not recommended? Limit your intake of the following foods, or as told by your dietitian. Talk to your dietitian about specific foods you should avoid based on the type of kidney stones and your overall health. Grains  Breads. Bagels. Rolls. Baked goods. Salted crackers. Cereal. Pasta. Vegetables  Spinach. Rhubarb. Beets. Canned vegetables. Pickles. Olives. Meats and other protein foods  Nuts. Nut butters. Large portions of meat, poultry, or fish. Salted or cured meats. Deli meats. Hot dogs. Sausages. Dairy  Cheese. Beverages  Regular soft drinks. Regular vegetable juice. Seasonings and other foods  Seasoning blends with salt. Salad dressings. Canned soups. Soy sauce. Ketchup. Barbecue sauce. Canned pasta sauce. Casseroles. Pizza. Lasagna. Frozen meals. Potato chips. French fries. Summary  You can reduce your risk of kidney stones by making changes to your diet.  The most important thing you can do is drink enough fluid. You should drink enough fluid to keep your urine clear or pale yellow.  Ask your health care provider or dietitian how much protein from animal sources you should eat each day, and also how much salt and calcium you should have each day. This information is not intended to replace advice given to you by your health care provider. Make sure you discuss any questions you have with your health care provider. Document Released: 09/06/2010 Document Revised: 04/22/2016 Document Reviewed: 04/22/2016 Elsevier Interactive Patient Education  2017 [employer]  

## 2018-12-12 IMAGING — CT CT ABD-PELV W/ CM
2 of 5 series · 16 of 46 positions shown, 18 images · IV contrast (APPLIED)
Comparison: None.

CLINICAL DATA: Bilateral lower quadrant pain beginning today with
nausea. History of hernia repair.

EXAM:
CT ABDOMEN AND PELVIS WITH CONTRAST
TECHNIQUE: Multidetector CT imaging of the abdomen and pelvis was performed
using the standard protocol following bolus administration of
intravenous contrast.
CONTRAST:  75mL HGYZ11-ERR IOPAMIDOL (HGYZ11-ERR) INJECTION 61%

[Series 2: routine abd/pel with · axial · 0.79mm/px · z∈[-486,-86]mm · 13 of 91 slices shown, 15 images]
[im 6/91  soft-tissue]
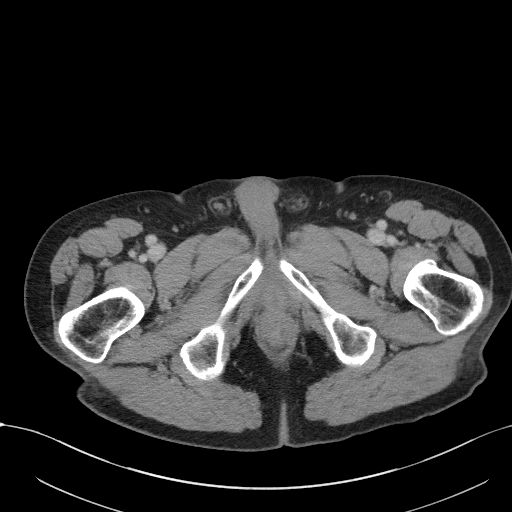
[im 6/91  bone]
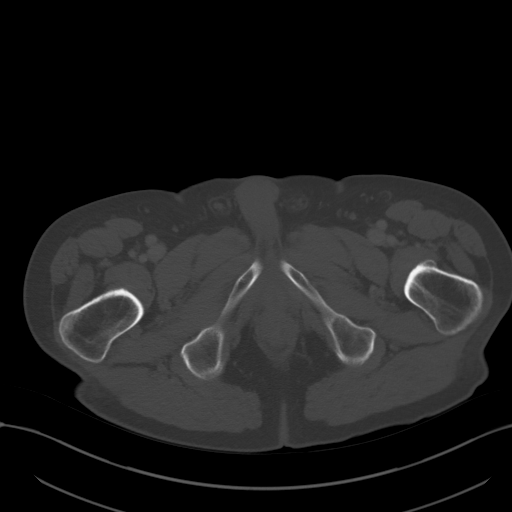
[im 11/91  soft-tissue]
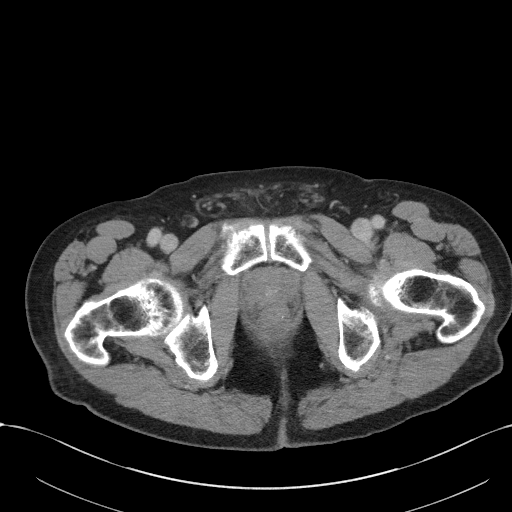
[im 21/91  soft-tissue]
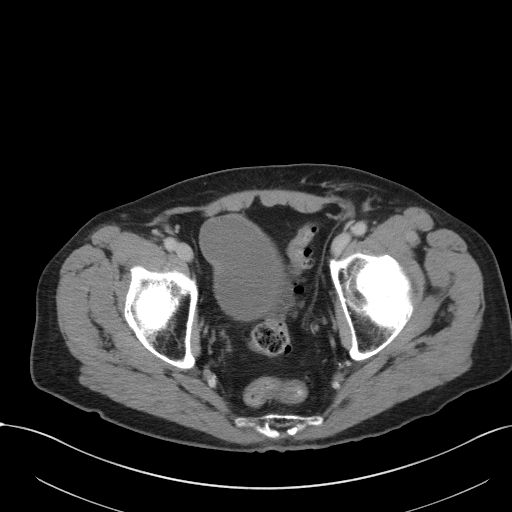
[im 26/91  soft-tissue]
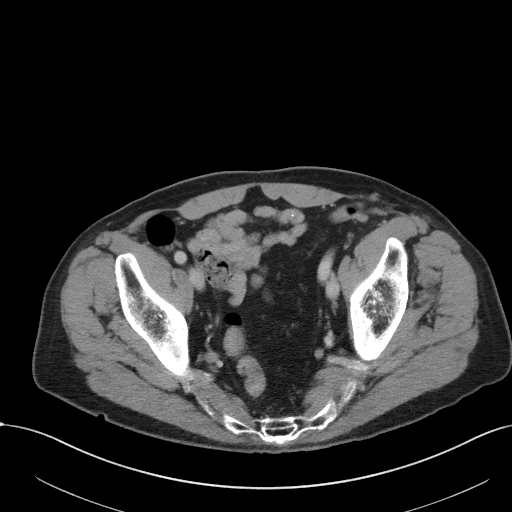
[im 31/91  soft-tissue]
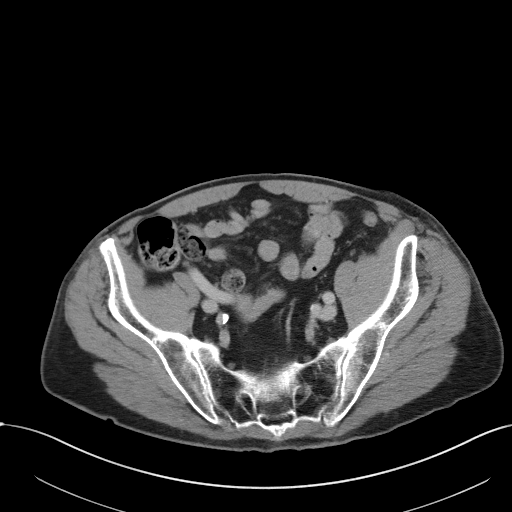
[im 41/91  soft-tissue]
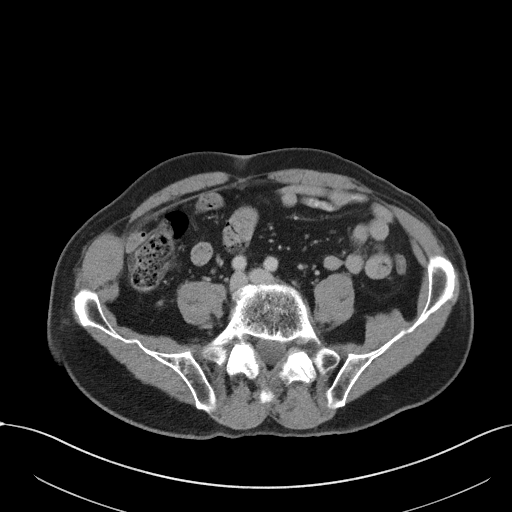
[im 46/91  soft-tissue]
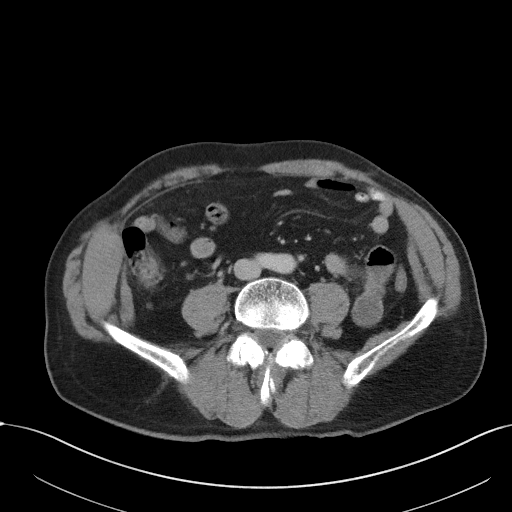
[im 51/91  soft-tissue]
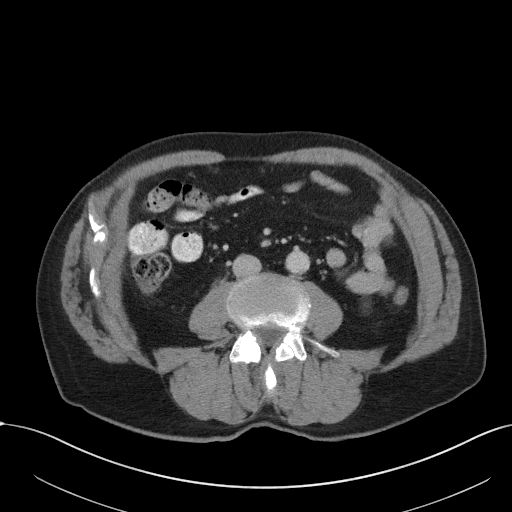
[im 61/91  soft-tissue]
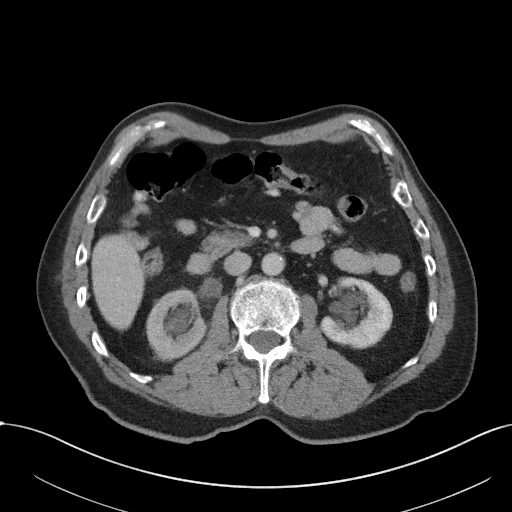
[im 61/91  bone]
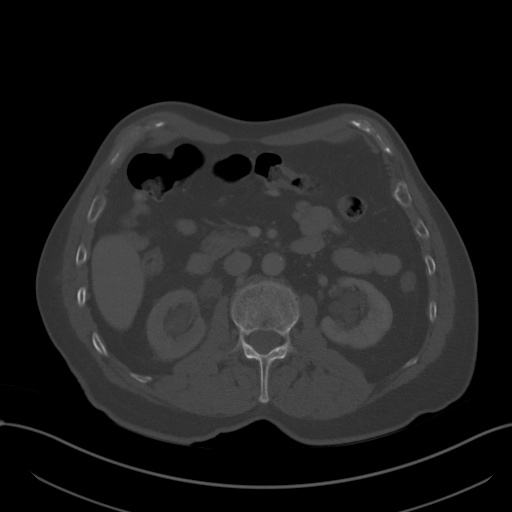
[im 66/91  soft-tissue]
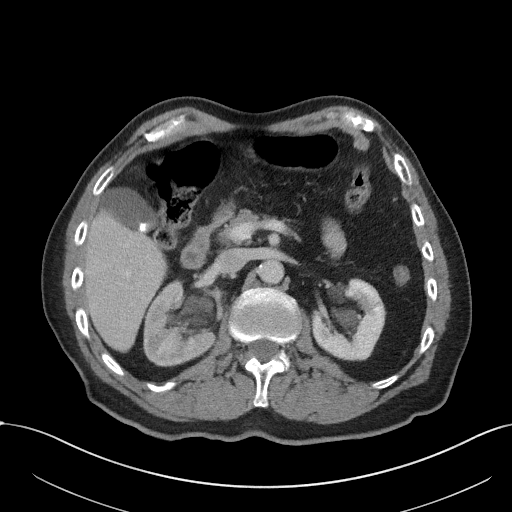
[im 71/91  soft-tissue]
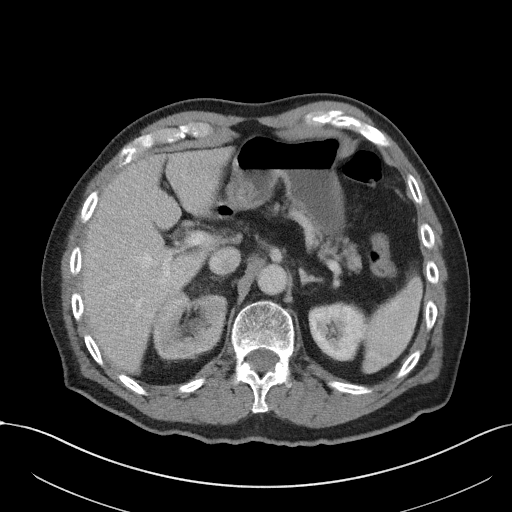
[im 81/91  soft-tissue]
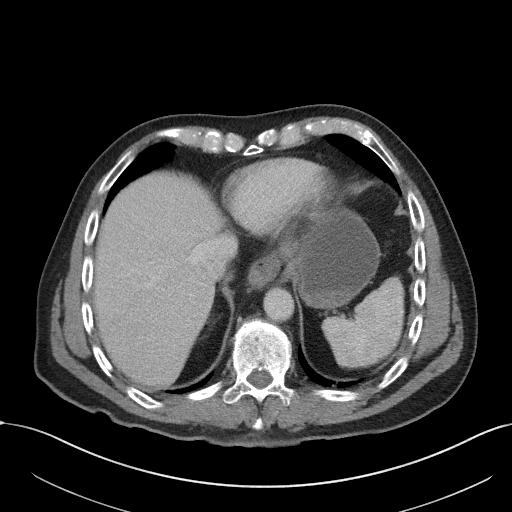
[im 86/91  soft-tissue]
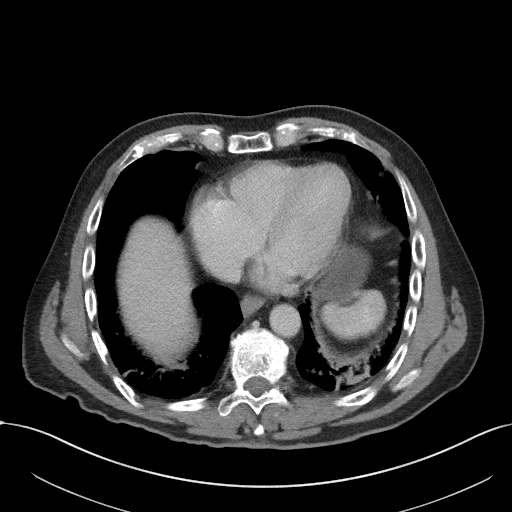

[Series 5: coronal st · coronal · 0.68mm/px · 3 of 93 slices shown]
[im 31/93  soft-tissue]
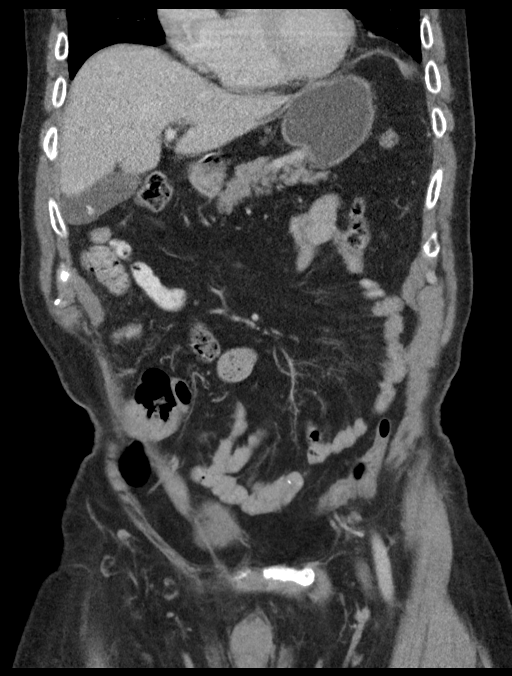
[im 41/93  soft-tissue]
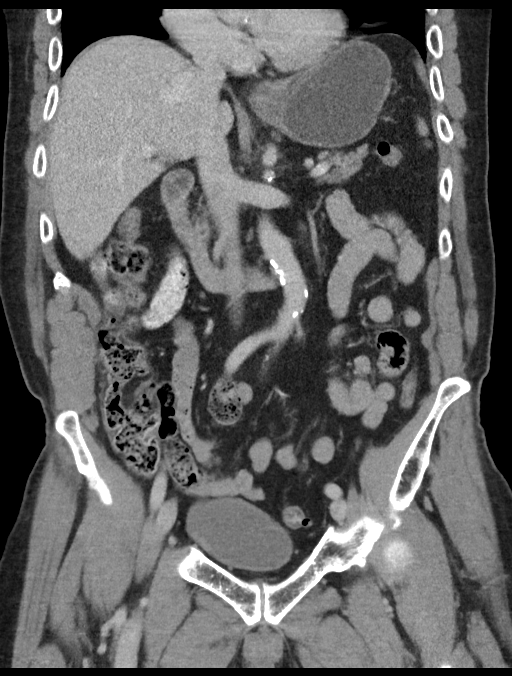
[im 52/93  soft-tissue]
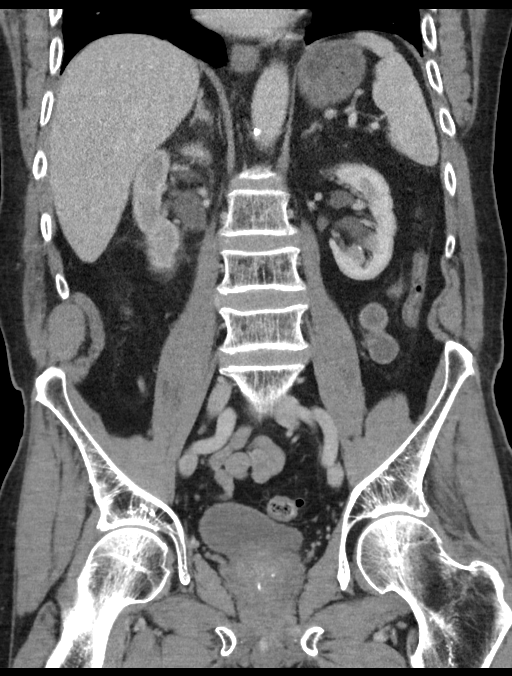

[16 of 46 positions shown; findings below may reference images not displayed]

FINDINGS: LOWER CHEST: Dependent atelectasis or scarring. 3 mm sub solid RIGHT
middle lobe subpleural pulmonary nodules likely benign. The heart is
mildly enlarged. No pericardial effusions. 19 mm partially imaged
cystic lesion adjacent to RIGHT atrium suggesting pericardial cyst.

HEPATOBILIARY: Multiple subcentimeter gallstones without CT findings
of acute cholecystitis. Normal liver.

PANCREAS: Normal.

SPLEEN: Normal.

ADRENALS/URINARY TRACT: Kidneys are orthotopic, delayed RIGHT
nephrogram. Moderate RIGHT hydro nephrosis to the proximal ureter
where a 5 x 8 x 10 mm calculus is present. Small LEFT parapelvic
cysts and 2 mm LEFT lower pole nephrolithiasis. No RIGHT contrast
excretion on delayed phase. No solid renal mass. Urinary bladder is
well distended and unremarkable.

STOMACH/BOWEL: The stomach, small and large bowel are normal in
course and caliber without inflammatory changes. Mild sigmoid
diverticulosis. Normal appendix.

VASCULAR/LYMPHATIC: Aortoiliac vessels are normal in course and
caliber, mild calcific atherosclerosis. No lymphadenopathy by CT
size criteria.

REPRODUCTIVE: Mild prostatomegaly.

OTHER: No intraperitoneal free fluid or free air.

MUSCULOSKELETAL: Nonacute. Status post LEFT inguinal hernia repair
with small fat containing inguinal hernia. Osteopenia.
IMPRESSION: 1. 5 x 8 x 10 mm proximal RIGHT ureteral calculus results in
moderate obstructive uropathy and decreased RIGHT renal function.
2. 2 mm nonobstructing LEFT lower pole nephrolithiasis.
3. Cholelithiasis without CT findings of acute cholecystitis.
Aortic Atherosclerosis (Q52JJ-9DE.E).

## 2021-05-01 ENCOUNTER — Inpatient Hospital Stay: Payer: Medicare PPO

## 2021-05-01 ENCOUNTER — Other Ambulatory Visit: Payer: Self-pay

## 2021-05-01 ENCOUNTER — Encounter (INDEPENDENT_AMBULATORY_CARE_PROVIDER_SITE_OTHER): Payer: Self-pay

## 2021-05-01 ENCOUNTER — Encounter: Payer: Self-pay | Admitting: Internal Medicine

## 2021-05-01 ENCOUNTER — Inpatient Hospital Stay: Payer: Medicare PPO | Attending: Internal Medicine | Admitting: Internal Medicine

## 2021-05-01 DIAGNOSIS — Z86718 Personal history of other venous thrombosis and embolism: Secondary | ICD-10-CM | POA: Diagnosis not present

## 2021-05-01 DIAGNOSIS — I82812 Embolism and thrombosis of superficial veins of left lower extremities: Secondary | ICD-10-CM

## 2021-05-01 DIAGNOSIS — Z7901 Long term (current) use of anticoagulants: Secondary | ICD-10-CM | POA: Insufficient documentation

## 2021-05-01 NOTE — Assessment & Plan Note (Addendum)
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  Repeat Dopplers-November 2022 resolved.  Recommend finishing up Eliquis-until December 2022; and then stop any further anticoagulation. Discussed the length of anticoagulation is approximately 6 weeks to 3 months or so for SVT-lower extremity.  #I reviewed recent D-dimer levels which was slightly elevated.  However, I had a long discussion with the daughter/patient regarding the risk of bleeding/falls given his age; and hence would recommend stopping anticoagulation end of December 2022.  If patient does develop repeated DVT/PE he would be candidate for long-term anticoagulation.  #Etiology: Unclear; ?  Unprovoked.  No obvious provoking incident noted-except his age/prior history of SVT.  However I would not recommend any further hypercoagulable work-up at this time-as this would not change my current management.   Thank you Dr.Gibson for allowing me to participate in the care of your pleasant patient. Please do not hesitate to contact me with questions or concerns in the interim.  # DISPOSITION:  # no labs today # Follow up as needed  Cc: PCP.

## 2021-05-01 NOTE — Progress Notes (Signed)
New patient evaluation.   

## 2021-05-01 NOTE — Progress Notes (Signed)
Lance Jones CONSULT NOTE  Patient Care Team: Lance Richmond, MD as PCP - General (Family Medicine)  CHIEF COMPLAINTS/PURPOSE OF CONSULTATION: DVT/PE  # AUG 10th, 2022- No evidence of DVT in the left lower extremity; Occlusive thrombus within a large dilated superficial vein in the upper calf, possibly the saphenous vein. There is also thrombus within smaller varicosities in this region. On eliquis REPEAT LE NOV 2022- NEG.  # 2018- Hx of kidney stone [s/p extraction;Dr.Brandon]  Oncology History   No history exists.     HISTORY OF PRESENTING ILLNESS: Ambulating independently.  Accompanied by his daughter.  Lance Jones 87 y.o.  male is here for follow-up and further recommendations regarding anticoagulation for left lower extremity SVT.  Patient noted to have pain in his left leg-which led to further work-up with an ultrasound as noted above.  No provoking factors; except looks like patient had a prior history of SVT of the same leg more than 20 years ago.  Currently patient is on Eliquis for the last 4 months.  No blood in stools or black or stools.  With regards risk factors: Long distance travel-none Immobilization/trauma: none Previous history of SVT- [> 20 year]- no anti-coagulation.  Family history: none Cancer screening: never had colonoscopy.   Review of Systems  Constitutional:  Negative for chills, diaphoresis, fever, malaise/fatigue and weight loss.  HENT:  Negative for nosebleeds and sore throat.   Eyes:  Negative for double vision.  Respiratory:  Negative for cough, hemoptysis, sputum production, shortness of breath and wheezing.   Cardiovascular:  Negative for chest pain, palpitations, orthopnea and leg swelling.  Gastrointestinal:  Negative for abdominal pain, blood in stool, constipation, diarrhea, heartburn, melena, nausea and vomiting.  Genitourinary:  Negative for dysuria, frequency and urgency.  Musculoskeletal:  Negative for back pain  and joint pain.  Skin: Negative.  Negative for itching and rash.  Neurological:  Negative for dizziness, tingling, focal weakness, weakness and headaches.  Endo/Heme/Allergies:  Does not bruise/bleed easily.  Psychiatric/Behavioral:  Negative for depression. The patient is not nervous/anxious and does not have insomnia.     MEDICAL HISTORY:  Past Medical History:  Diagnosis Date   B12 deficiency 02/13/2014   DVT (deep venous thrombosis) (HCC) 1998   left leg   Facet arthritis of cervical region 08/08/2016   History of kidney stones    Hypercholesteremia    Hyperlipidemia 02/13/2014   Right ureteral stone 12/01/2016   Ureteral calculus, right 11/30/2016   Urinary tract infection 11/30/2016    SURGICAL HISTORY: Past Surgical History:  Procedure Laterality Date   CYSTOSCOPY W/ RETROGRADES Right 12/15/2016   Procedure: CYSTOSCOPY WITH RETROGRADE PYELOGRAM;  Surgeon: Vanna Scotland, MD;  Location: [facility];  Service: Urology;  Laterality: Right;   CYSTOSCOPY WITH STENT PLACEMENT Right 12/01/2016   Procedure: CYSTOSCOPY WITH STENT PLACEMENT;  Surgeon: Bjorn Pippin, MD;  Location: [facility];  Service: Urology;  Laterality: Right;   CYSTOSCOPY/URETEROSCOPY/HOLMIUM LASER/STENT PLACEMENT Right 12/15/2016   Procedure: CYSTOSCOPY/URETEROSCOPY/HOLMIUM LASER/STENT EXCHANGE;  Surgeon: Vanna Scotland, MD;  Location: [facility];  Service: Urology;  Laterality: Right;   EYE SURGERY Left    Cataract Extraction with IOL   FRACTURE SURGERY Left 1970   Leg   HERNIA REPAIR Left 2008   Inguinal Hernia Repair    SOCIAL HISTORY: Social History   Socioeconomic History   Marital status: Widowed    Spouse name: Not on file   Number of children: Not on file   Years of education: Not  on file   Highest education level: Not on file  Occupational History   Not on file  Tobacco Use   Smoking status: Never   Smokeless tobacco: Never  Vaping Use   Vaping Use: Never used  Substance and Sexual Activity   Alcohol  use: No   Drug use: No   Sexual activity: Never  Other Topics Concern   Not on file  Social History Narrative   Not on file   Social Determinants of Health   Financial Resource Strain: Not on file  Food Insecurity: Not on file  Transportation Needs: Not on file  Physical Activity: Not on file  Stress: Not on file  Social Connections: Not on file  Intimate Partner Violence: Not on file    FAMILY HISTORY: Family History  Problem Relation Age of Onset   Cancer Neg Hx     ALLERGIES:  has No Known Allergies.  MEDICATIONS:  Current Outpatient Medications  Medication Sig Dispense Refill   aspirin EC 81 MG tablet Take 81 mg by mouth daily.     atorvastatin (LIPITOR) 20 MG tablet Take 20 mg by mouth daily.  1   cyanocobalamin (,VITAMIN B-12,) 1000 MCG/ML injection Inject 1,000 mcg into the skin every 30 (thirty) days.      Hypromellose (ARTIFICIAL TEARS OP) Place 2 drops into both eyes 2 (two) times daily as needed (dry eyes).     docusate sodium (COLACE) 100 MG capsule Take 1 capsule (100 mg total) by mouth 2 (two) times daily. (Patient not taking: Reported on 01/27/2017) 60 capsule 0   Multiple Vitamins-Minerals (PRESERVISION AREDS PO) Take 1 tablet by mouth daily. (Patient not taking: Reported on 05/01/2021)     No current facility-administered medications for this visit.       PHYSICAL EXAMINATION:  There were no vitals filed for this visit. There were no vitals filed for this visit.  Physical Exam Vitals and nursing note reviewed.  HENT:     Head: Normocephalic and atraumatic.     Mouth/Throat:     Pharynx: Oropharynx is clear.  Eyes:     Extraocular Movements: Extraocular movements intact.     Pupils: Pupils are equal, round, and reactive to light.  Cardiovascular:     Rate and Rhythm: Normal rate and regular rhythm.  Pulmonary:     Comments: Decreased breath sounds bilaterally.  Abdominal:     Palpations: Abdomen is soft.  Musculoskeletal:        General:  Normal range of motion.     Cervical back: Normal range of motion.  Skin:    General: Skin is warm.  Neurological:     General: No focal deficit present.     Mental Status: He is alert and oriented to person, place, and time.  Psychiatric:        Behavior: Behavior normal.        Judgment: Judgment normal.     LABORATORY DATA:  I have reviewed the data as listed Lab Results  Component Value Date   WBC 11.0 (H) 11/30/2016   HGB 15.0 12/01/2016   HCT 43.7 12/01/2016   MCV 92.4 11/30/2016   PLT 196 11/30/2016   No results for input(s): NA, K, CL, CO2, GLUCOSE, BUN, CREATININE, CALCIUM, GFRNONAA, GFRAA, PROT, ALBUMIN, AST, ALT, ALKPHOS, BILITOT, BILIDIR, IBILI in the last 8760 hours.  RADIOGRAPHIC STUDIES: I have personally reviewed the radiological images as listed and agreed with the findings in the report. No results found.  ASSESSMENT & PLAN:  Saphenous vein clot, left #SVT of the left lower extremity/saphenous vein-August 2022.  Repeat Dopplers-November 2022 resolved.  Recommend finishing up Eliquis-until December 2022; and then stop any further anticoagulation. Discussed the length of anticoagulation is approximately 6 weeks to 3 months or so for SVT-lower extremity.  #I reviewed recent D-dimer levels which was slightly elevated.  However, I had a long discussion with the daughter/patient regarding the risk of bleeding/falls given his age; and hence would recommend stopping anticoagulation end of December 2022.  If patient does develop repeated DVT/PE he would be candidate for long-term anticoagulation.  #Etiology: Unclear; ?  Unprovoked.  No obvious provoking incident noted-except his age/prior history of SVT.  However I would not recommend any further hypercoagulable work-up at this time-as this would not change my current management.   Thank you Dr.Gibson for allowing me to participate in the care of your pleasant patient. Please do not hesitate to contact me with  questions or concerns in the interim.  # DISPOSITION:  # no labs today # Follow up as needed  Cc: PCP.   All questions were answered. The patient knows to call the clinic with any problems, questions or concerns.    Earna Coder, MD 05/01/2021 12:59 PM
